# Patient Record
Sex: Male | Born: 1950 | Race: White | Hispanic: No | Marital: Married | Smoking: Former smoker
Health system: Southern US, Community
[De-identification: ages and names within clinical notes are randomized; demographics above are authoritative.]

## PROBLEM LIST (undated history)

## (undated) DIAGNOSIS — K56609 Unspecified intestinal obstruction, unspecified as to partial versus complete obstruction: Secondary | ICD-10-CM

## (undated) DIAGNOSIS — I10 Essential (primary) hypertension: Secondary | ICD-10-CM

## (undated) DIAGNOSIS — J189 Pneumonia, unspecified organism: Secondary | ICD-10-CM

## (undated) HISTORY — DX: Essential (primary) hypertension: I10

## (undated) HISTORY — DX: Unspecified intestinal obstruction, unspecified as to partial versus complete obstruction: K56.609

## (undated) HISTORY — DX: Porphyria cutanea tarda: E80.1

## (undated) HISTORY — DX: Pneumonia, unspecified organism: J18.9

---

## 1999-11-15 ENCOUNTER — Other Ambulatory Visit: Admission: RE | Admit: 1999-11-15 | Discharge: 1999-11-15 | Payer: Self-pay | Admitting: Gastroenterology

## 2014-05-08 LAB — HM COLONOSCOPY

## 2014-08-11 HISTORY — PX: HEMORROIDECTOMY: SUR656

## 2015-04-19 DIAGNOSIS — Z0000 Encounter for general adult medical examination without abnormal findings: Secondary | ICD-10-CM | POA: Diagnosis not present

## 2015-04-21 DIAGNOSIS — Z1159 Encounter for screening for other viral diseases: Secondary | ICD-10-CM | POA: Diagnosis not present

## 2015-04-21 DIAGNOSIS — Z6829 Body mass index (BMI) 29.0-29.9, adult: Secondary | ICD-10-CM | POA: Diagnosis not present

## 2015-04-21 DIAGNOSIS — Z0000 Encounter for general adult medical examination without abnormal findings: Secondary | ICD-10-CM | POA: Diagnosis not present

## 2015-06-14 DIAGNOSIS — Z4682 Encounter for fitting and adjustment of non-vascular catheter: Secondary | ICD-10-CM | POA: Diagnosis not present

## 2015-06-14 DIAGNOSIS — R109 Unspecified abdominal pain: Secondary | ICD-10-CM | POA: Diagnosis not present

## 2015-06-14 DIAGNOSIS — K5660 Unspecified intestinal obstruction: Secondary | ICD-10-CM | POA: Diagnosis not present

## 2015-06-14 DIAGNOSIS — Z87891 Personal history of nicotine dependence: Secondary | ICD-10-CM | POA: Diagnosis not present

## 2015-06-14 DIAGNOSIS — K6389 Other specified diseases of intestine: Secondary | ICD-10-CM | POA: Diagnosis not present

## 2015-06-14 DIAGNOSIS — R030 Elevated blood-pressure reading, without diagnosis of hypertension: Secondary | ICD-10-CM | POA: Diagnosis present

## 2015-06-14 DIAGNOSIS — K5669 Other intestinal obstruction: Secondary | ICD-10-CM | POA: Diagnosis not present

## 2015-08-08 DIAGNOSIS — H903 Sensorineural hearing loss, bilateral: Secondary | ICD-10-CM | POA: Diagnosis not present

## 2016-03-25 DIAGNOSIS — H1033 Unspecified acute conjunctivitis, bilateral: Secondary | ICD-10-CM | POA: Diagnosis not present

## 2016-03-25 DIAGNOSIS — Z6830 Body mass index (BMI) 30.0-30.9, adult: Secondary | ICD-10-CM | POA: Diagnosis not present

## 2016-04-15 DIAGNOSIS — H1032 Unspecified acute conjunctivitis, left eye: Secondary | ICD-10-CM | POA: Diagnosis not present

## 2016-05-29 DIAGNOSIS — Z6830 Body mass index (BMI) 30.0-30.9, adult: Secondary | ICD-10-CM | POA: Diagnosis not present

## 2016-05-29 DIAGNOSIS — Z0000 Encounter for general adult medical examination without abnormal findings: Secondary | ICD-10-CM | POA: Diagnosis not present

## 2016-05-29 DIAGNOSIS — Z136 Encounter for screening for cardiovascular disorders: Secondary | ICD-10-CM | POA: Diagnosis not present

## 2016-05-29 DIAGNOSIS — Z13220 Encounter for screening for lipoid disorders: Secondary | ICD-10-CM | POA: Diagnosis not present

## 2016-05-29 DIAGNOSIS — Z130 Encounter for screening for diseases of the blood and blood-forming organs and certain disorders involving the immune mechanism: Secondary | ICD-10-CM | POA: Diagnosis not present

## 2016-05-29 DIAGNOSIS — Z125 Encounter for screening for malignant neoplasm of prostate: Secondary | ICD-10-CM | POA: Diagnosis not present

## 2016-05-29 DIAGNOSIS — Z23 Encounter for immunization: Secondary | ICD-10-CM | POA: Diagnosis not present

## 2016-05-29 DIAGNOSIS — Z1159 Encounter for screening for other viral diseases: Secondary | ICD-10-CM | POA: Diagnosis not present

## 2016-05-29 DIAGNOSIS — Z131 Encounter for screening for diabetes mellitus: Secondary | ICD-10-CM | POA: Diagnosis not present

## 2016-06-07 DIAGNOSIS — Z130 Encounter for screening for diseases of the blood and blood-forming organs and certain disorders involving the immune mechanism: Secondary | ICD-10-CM | POA: Diagnosis not present

## 2016-06-07 DIAGNOSIS — Z13220 Encounter for screening for lipoid disorders: Secondary | ICD-10-CM | POA: Diagnosis not present

## 2016-06-07 DIAGNOSIS — Z1159 Encounter for screening for other viral diseases: Secondary | ICD-10-CM | POA: Diagnosis not present

## 2016-06-07 DIAGNOSIS — Z125 Encounter for screening for malignant neoplasm of prostate: Secondary | ICD-10-CM | POA: Diagnosis not present

## 2016-06-07 DIAGNOSIS — Z131 Encounter for screening for diabetes mellitus: Secondary | ICD-10-CM | POA: Diagnosis not present

## 2016-07-23 DIAGNOSIS — K56609 Unspecified intestinal obstruction, unspecified as to partial versus complete obstruction: Secondary | ICD-10-CM | POA: Diagnosis not present

## 2016-07-23 DIAGNOSIS — Z87891 Personal history of nicotine dependence: Secondary | ICD-10-CM | POA: Diagnosis not present

## 2016-07-23 DIAGNOSIS — K56699 Other intestinal obstruction unspecified as to partial versus complete obstruction: Secondary | ICD-10-CM | POA: Diagnosis not present

## 2016-07-23 DIAGNOSIS — I10 Essential (primary) hypertension: Secondary | ICD-10-CM | POA: Diagnosis present

## 2016-07-23 DIAGNOSIS — J029 Acute pharyngitis, unspecified: Secondary | ICD-10-CM | POA: Diagnosis not present

## 2016-07-23 DIAGNOSIS — R51 Headache: Secondary | ICD-10-CM | POA: Diagnosis present

## 2016-11-22 DIAGNOSIS — Z87891 Personal history of nicotine dependence: Secondary | ICD-10-CM | POA: Diagnosis not present

## 2016-11-22 DIAGNOSIS — Z7189 Other specified counseling: Secondary | ICD-10-CM | POA: Diagnosis not present

## 2016-11-22 DIAGNOSIS — K56609 Unspecified intestinal obstruction, unspecified as to partial versus complete obstruction: Secondary | ICD-10-CM | POA: Diagnosis not present

## 2016-11-22 DIAGNOSIS — J181 Lobar pneumonia, unspecified organism: Secondary | ICD-10-CM | POA: Diagnosis present

## 2016-11-22 DIAGNOSIS — Z23 Encounter for immunization: Secondary | ICD-10-CM | POA: Diagnosis not present

## 2016-11-22 DIAGNOSIS — K56600 Partial intestinal obstruction, unspecified as to cause: Secondary | ICD-10-CM | POA: Diagnosis not present

## 2016-11-22 DIAGNOSIS — I10 Essential (primary) hypertension: Secondary | ICD-10-CM | POA: Diagnosis present

## 2016-12-04 DIAGNOSIS — J90 Pleural effusion, not elsewhere classified: Secondary | ICD-10-CM | POA: Diagnosis not present

## 2016-12-04 DIAGNOSIS — Z6828 Body mass index (BMI) 28.0-28.9, adult: Secondary | ICD-10-CM | POA: Diagnosis not present

## 2016-12-04 DIAGNOSIS — K56609 Unspecified intestinal obstruction, unspecified as to partial versus complete obstruction: Secondary | ICD-10-CM | POA: Diagnosis not present

## 2016-12-04 DIAGNOSIS — J181 Lobar pneumonia, unspecified organism: Secondary | ICD-10-CM | POA: Diagnosis not present

## 2017-04-08 DIAGNOSIS — M955 Acquired deformity of pelvis: Secondary | ICD-10-CM | POA: Diagnosis not present

## 2017-04-08 DIAGNOSIS — M9905 Segmental and somatic dysfunction of pelvic region: Secondary | ICD-10-CM | POA: Diagnosis not present

## 2017-04-08 DIAGNOSIS — M5416 Radiculopathy, lumbar region: Secondary | ICD-10-CM | POA: Diagnosis not present

## 2017-04-08 DIAGNOSIS — M9903 Segmental and somatic dysfunction of lumbar region: Secondary | ICD-10-CM | POA: Diagnosis not present

## 2017-04-09 DIAGNOSIS — M955 Acquired deformity of pelvis: Secondary | ICD-10-CM | POA: Diagnosis not present

## 2017-04-09 DIAGNOSIS — M9905 Segmental and somatic dysfunction of pelvic region: Secondary | ICD-10-CM | POA: Diagnosis not present

## 2017-04-09 DIAGNOSIS — M5416 Radiculopathy, lumbar region: Secondary | ICD-10-CM | POA: Diagnosis not present

## 2017-04-09 DIAGNOSIS — M9903 Segmental and somatic dysfunction of lumbar region: Secondary | ICD-10-CM | POA: Diagnosis not present

## 2017-04-11 DIAGNOSIS — M955 Acquired deformity of pelvis: Secondary | ICD-10-CM | POA: Diagnosis not present

## 2017-04-11 DIAGNOSIS — M9903 Segmental and somatic dysfunction of lumbar region: Secondary | ICD-10-CM | POA: Diagnosis not present

## 2017-04-11 DIAGNOSIS — M5416 Radiculopathy, lumbar region: Secondary | ICD-10-CM | POA: Diagnosis not present

## 2017-04-11 DIAGNOSIS — M9905 Segmental and somatic dysfunction of pelvic region: Secondary | ICD-10-CM | POA: Diagnosis not present

## 2017-04-14 DIAGNOSIS — M5416 Radiculopathy, lumbar region: Secondary | ICD-10-CM | POA: Diagnosis not present

## 2017-04-14 DIAGNOSIS — M955 Acquired deformity of pelvis: Secondary | ICD-10-CM | POA: Diagnosis not present

## 2017-04-14 DIAGNOSIS — M9903 Segmental and somatic dysfunction of lumbar region: Secondary | ICD-10-CM | POA: Diagnosis not present

## 2017-04-14 DIAGNOSIS — M9905 Segmental and somatic dysfunction of pelvic region: Secondary | ICD-10-CM | POA: Diagnosis not present

## 2017-04-16 DIAGNOSIS — M9905 Segmental and somatic dysfunction of pelvic region: Secondary | ICD-10-CM | POA: Diagnosis not present

## 2017-04-16 DIAGNOSIS — M5416 Radiculopathy, lumbar region: Secondary | ICD-10-CM | POA: Diagnosis not present

## 2017-04-16 DIAGNOSIS — M9903 Segmental and somatic dysfunction of lumbar region: Secondary | ICD-10-CM | POA: Diagnosis not present

## 2017-04-16 DIAGNOSIS — M955 Acquired deformity of pelvis: Secondary | ICD-10-CM | POA: Diagnosis not present

## 2017-04-17 DIAGNOSIS — M9903 Segmental and somatic dysfunction of lumbar region: Secondary | ICD-10-CM | POA: Diagnosis not present

## 2017-04-17 DIAGNOSIS — M5416 Radiculopathy, lumbar region: Secondary | ICD-10-CM | POA: Diagnosis not present

## 2017-04-17 DIAGNOSIS — M9905 Segmental and somatic dysfunction of pelvic region: Secondary | ICD-10-CM | POA: Diagnosis not present

## 2017-04-17 DIAGNOSIS — M955 Acquired deformity of pelvis: Secondary | ICD-10-CM | POA: Diagnosis not present

## 2017-04-21 DIAGNOSIS — M9905 Segmental and somatic dysfunction of pelvic region: Secondary | ICD-10-CM | POA: Diagnosis not present

## 2017-04-21 DIAGNOSIS — M5416 Radiculopathy, lumbar region: Secondary | ICD-10-CM | POA: Diagnosis not present

## 2017-04-21 DIAGNOSIS — M9903 Segmental and somatic dysfunction of lumbar region: Secondary | ICD-10-CM | POA: Diagnosis not present

## 2017-04-21 DIAGNOSIS — M955 Acquired deformity of pelvis: Secondary | ICD-10-CM | POA: Diagnosis not present

## 2017-04-23 DIAGNOSIS — M9903 Segmental and somatic dysfunction of lumbar region: Secondary | ICD-10-CM | POA: Diagnosis not present

## 2017-04-23 DIAGNOSIS — M9905 Segmental and somatic dysfunction of pelvic region: Secondary | ICD-10-CM | POA: Diagnosis not present

## 2017-04-23 DIAGNOSIS — M955 Acquired deformity of pelvis: Secondary | ICD-10-CM | POA: Diagnosis not present

## 2017-04-23 DIAGNOSIS — M5416 Radiculopathy, lumbar region: Secondary | ICD-10-CM | POA: Diagnosis not present

## 2017-04-24 DIAGNOSIS — M5416 Radiculopathy, lumbar region: Secondary | ICD-10-CM | POA: Diagnosis not present

## 2017-04-24 DIAGNOSIS — M9905 Segmental and somatic dysfunction of pelvic region: Secondary | ICD-10-CM | POA: Diagnosis not present

## 2017-04-24 DIAGNOSIS — M9903 Segmental and somatic dysfunction of lumbar region: Secondary | ICD-10-CM | POA: Diagnosis not present

## 2017-04-24 DIAGNOSIS — M955 Acquired deformity of pelvis: Secondary | ICD-10-CM | POA: Diagnosis not present

## 2017-04-29 DIAGNOSIS — M9905 Segmental and somatic dysfunction of pelvic region: Secondary | ICD-10-CM | POA: Diagnosis not present

## 2017-04-29 DIAGNOSIS — M955 Acquired deformity of pelvis: Secondary | ICD-10-CM | POA: Diagnosis not present

## 2017-04-29 DIAGNOSIS — M9903 Segmental and somatic dysfunction of lumbar region: Secondary | ICD-10-CM | POA: Diagnosis not present

## 2017-04-29 DIAGNOSIS — M5416 Radiculopathy, lumbar region: Secondary | ICD-10-CM | POA: Diagnosis not present

## 2017-05-01 DIAGNOSIS — M955 Acquired deformity of pelvis: Secondary | ICD-10-CM | POA: Diagnosis not present

## 2017-05-01 DIAGNOSIS — M5416 Radiculopathy, lumbar region: Secondary | ICD-10-CM | POA: Diagnosis not present

## 2017-05-01 DIAGNOSIS — M9903 Segmental and somatic dysfunction of lumbar region: Secondary | ICD-10-CM | POA: Diagnosis not present

## 2017-05-01 DIAGNOSIS — M9905 Segmental and somatic dysfunction of pelvic region: Secondary | ICD-10-CM | POA: Diagnosis not present

## 2017-05-06 DIAGNOSIS — M5416 Radiculopathy, lumbar region: Secondary | ICD-10-CM | POA: Diagnosis not present

## 2017-05-06 DIAGNOSIS — M9903 Segmental and somatic dysfunction of lumbar region: Secondary | ICD-10-CM | POA: Diagnosis not present

## 2017-05-06 DIAGNOSIS — M955 Acquired deformity of pelvis: Secondary | ICD-10-CM | POA: Diagnosis not present

## 2017-05-06 DIAGNOSIS — M9905 Segmental and somatic dysfunction of pelvic region: Secondary | ICD-10-CM | POA: Diagnosis not present

## 2017-05-08 DIAGNOSIS — M955 Acquired deformity of pelvis: Secondary | ICD-10-CM | POA: Diagnosis not present

## 2017-05-08 DIAGNOSIS — M9905 Segmental and somatic dysfunction of pelvic region: Secondary | ICD-10-CM | POA: Diagnosis not present

## 2017-05-08 DIAGNOSIS — M5416 Radiculopathy, lumbar region: Secondary | ICD-10-CM | POA: Diagnosis not present

## 2017-05-08 DIAGNOSIS — M9903 Segmental and somatic dysfunction of lumbar region: Secondary | ICD-10-CM | POA: Diagnosis not present

## 2017-05-13 DIAGNOSIS — M955 Acquired deformity of pelvis: Secondary | ICD-10-CM | POA: Diagnosis not present

## 2017-05-13 DIAGNOSIS — M5416 Radiculopathy, lumbar region: Secondary | ICD-10-CM | POA: Diagnosis not present

## 2017-05-13 DIAGNOSIS — M9905 Segmental and somatic dysfunction of pelvic region: Secondary | ICD-10-CM | POA: Diagnosis not present

## 2017-05-13 DIAGNOSIS — M9903 Segmental and somatic dysfunction of lumbar region: Secondary | ICD-10-CM | POA: Diagnosis not present

## 2017-05-15 DIAGNOSIS — M9905 Segmental and somatic dysfunction of pelvic region: Secondary | ICD-10-CM | POA: Diagnosis not present

## 2017-05-15 DIAGNOSIS — M955 Acquired deformity of pelvis: Secondary | ICD-10-CM | POA: Diagnosis not present

## 2017-05-15 DIAGNOSIS — M9903 Segmental and somatic dysfunction of lumbar region: Secondary | ICD-10-CM | POA: Diagnosis not present

## 2017-05-15 DIAGNOSIS — M5416 Radiculopathy, lumbar region: Secondary | ICD-10-CM | POA: Diagnosis not present

## 2017-05-20 DIAGNOSIS — H903 Sensorineural hearing loss, bilateral: Secondary | ICD-10-CM | POA: Diagnosis not present

## 2017-05-21 DIAGNOSIS — M9903 Segmental and somatic dysfunction of lumbar region: Secondary | ICD-10-CM | POA: Diagnosis not present

## 2017-05-21 DIAGNOSIS — M5416 Radiculopathy, lumbar region: Secondary | ICD-10-CM | POA: Diagnosis not present

## 2017-05-21 DIAGNOSIS — M955 Acquired deformity of pelvis: Secondary | ICD-10-CM | POA: Diagnosis not present

## 2017-05-21 DIAGNOSIS — M9905 Segmental and somatic dysfunction of pelvic region: Secondary | ICD-10-CM | POA: Diagnosis not present

## 2017-06-04 DIAGNOSIS — M5416 Radiculopathy, lumbar region: Secondary | ICD-10-CM | POA: Diagnosis not present

## 2017-06-04 DIAGNOSIS — M9903 Segmental and somatic dysfunction of lumbar region: Secondary | ICD-10-CM | POA: Diagnosis not present

## 2017-06-04 DIAGNOSIS — M955 Acquired deformity of pelvis: Secondary | ICD-10-CM | POA: Diagnosis not present

## 2017-06-04 DIAGNOSIS — M9905 Segmental and somatic dysfunction of pelvic region: Secondary | ICD-10-CM | POA: Diagnosis not present

## 2017-06-25 DIAGNOSIS — M5416 Radiculopathy, lumbar region: Secondary | ICD-10-CM | POA: Diagnosis not present

## 2017-06-25 DIAGNOSIS — M9903 Segmental and somatic dysfunction of lumbar region: Secondary | ICD-10-CM | POA: Diagnosis not present

## 2017-06-25 DIAGNOSIS — M955 Acquired deformity of pelvis: Secondary | ICD-10-CM | POA: Diagnosis not present

## 2017-06-25 DIAGNOSIS — M9905 Segmental and somatic dysfunction of pelvic region: Secondary | ICD-10-CM | POA: Diagnosis not present

## 2017-06-30 ENCOUNTER — Encounter: Payer: Self-pay | Admitting: Primary Care

## 2017-06-30 ENCOUNTER — Encounter (INDEPENDENT_AMBULATORY_CARE_PROVIDER_SITE_OTHER): Payer: Self-pay

## 2017-06-30 ENCOUNTER — Ambulatory Visit (INDEPENDENT_AMBULATORY_CARE_PROVIDER_SITE_OTHER): Payer: Medicare Other | Admitting: Primary Care

## 2017-06-30 ENCOUNTER — Ambulatory Visit (INDEPENDENT_AMBULATORY_CARE_PROVIDER_SITE_OTHER)
Admission: RE | Admit: 2017-06-30 | Discharge: 2017-06-30 | Disposition: A | Discharge status: Home / Self Care | Payer: Medicare Other | Source: Ambulatory Visit | Attending: Primary Care | Admitting: Primary Care

## 2017-06-30 ENCOUNTER — Other Ambulatory Visit: Payer: Self-pay | Admitting: Primary Care

## 2017-06-30 VITALS — BP 140/70 | HR 73 | Temp 98.1°F | Ht 73.0 in | Wt 215.2 lb

## 2017-06-30 DIAGNOSIS — R2242 Localized swelling, mass and lump, left lower limb: Secondary | ICD-10-CM

## 2017-06-30 DIAGNOSIS — R011 Cardiac murmur, unspecified: Secondary | ICD-10-CM

## 2017-06-30 DIAGNOSIS — N529 Male erectile dysfunction, unspecified: Secondary | ICD-10-CM

## 2017-06-30 DIAGNOSIS — M19072 Primary osteoarthritis, left ankle and foot: Secondary | ICD-10-CM | POA: Diagnosis not present

## 2017-06-30 NOTE — Assessment & Plan Note (Signed)
Represents ganglion cyst on exam. Xray today without involvement to the bone, but suggesting Tophus given degeneration. Will check uric acid levels to rule this out, likely benign cyst.

## 2017-06-30 NOTE — Assessment & Plan Note (Signed)
Chronic for years. Will check morning testosterone and estrogen levels. Consider Urology referral for further evaluation. He will return for labs during the hours of 8am-10 am.

## 2017-06-30 NOTE — Assessment & Plan Note (Signed)
Evidence on exam. Given this finding and family history of congenital valve disorder, will obtain echocardiogram. He is asymptomatic.

## 2017-06-30 NOTE — Patient Instructions (Signed)
Complete xray(s) prior to leaving today. I will notify you of your results once received.  You will be contacted regarding your echocardiogram.  Please let us know if you have not been contacted within one week.   Schedule a lab only appointment to return between 8 am and 10 am.  It was a pleasure to meet you today! Please don't hesitate to call or message me with any questions. Welcome to [facility]!

## 2017-06-30 NOTE — Progress Notes (Signed)
Subjective:    Patient ID: Larry Bentley, male    DOB: November 30, 1950, 67 y.o.   MRN: [id number]  HPI  [name] is a 67 year old male who presents today to establish care and discuss the problems mentioned below. Will obtain old records.  BP Readings from Last 3 Encounters:  06/30/17 140/70   1) Erectile Dysfunction: Symptoms of difficulty maintaining an erection about 90% of the time during intercourse. He did see a doctor several years ago who checked labs including estrogen and testosterone. His testosterone levels were low and estrogen levels were high. He did take an OTC supplement which helped initially but doesn't help much now. He's not undergone re-evaluation in several years.   2) Valve Disorder: Family history of congenital valve disorder to either bicuspid or aortic valve. It was recommended he undergo an echocardiogram for further evaluation. His father and nephew were diagnosed with this condition. His nephew experienced a heart attack during a soccer game because of this disorder. He denies chest pain, shortness of breath, fatigue, lower extremity edema.  3) Foot Mass: Noticed to left medial foot at MTP joint that has been present for the last 2 years. He denies pain, difficulty walking, redness, swelling. He's not bothered by this mass but was curious as to the etiology.   Review of Systems  Eyes: Negative for visual disturbance.  Respiratory: Negative for shortness of breath.   Cardiovascular: Negative for chest pain.  Genitourinary:       Erectile dysfunction  Skin:       Superficial skin mass  Neurological: Negative for dizziness and headaches.       Past Medical History:  Diagnosis Date  . Hypertension   . Pneumonia   . Porphyria cutanea tarda (HCC)   . SBO (small bowel obstruction) (HCC)      Social History   Socioeconomic History  . Marital status: Married    Spouse name: Not on file  . Number of children: Not on file  . Years of education: Not on file    . Highest education level: Not on file  Occupational History  . Not on file  Social Needs  . Financial resource strain: Not on file  . Food insecurity:    Worry: Not on file    Inability: Not on file  . Transportation needs:    Medical: Not on file    Non-medical: Not on file  Tobacco Use  . Smoking status: Former Games developermoker  . Smokeless tobacco: Never Used  Substance and Sexual Activity  . Alcohol use: Yes  . Drug use: Not on file  . Sexual activity: Not on file  Lifestyle  . Physical activity:    Days per week: Not on file    Minutes per session: Not on file  . Stress: Not on file  Relationships  . Social connections:    Talks on phone: Not on file    Gets together: Not on file    Attends religious service: Not on file    Active member of club or organization: Not on file    Attends meetings of clubs or organizations: Not on file    Relationship status: Not on file  . Intimate partner violence:    Fear of current or ex partner: Not on file    Emotionally abused: Not on file    Physically abused: Not on file    Forced sexual activity: Not on file  Other Topics Concern  . Not on  file  Social History Narrative   Married.   1 biologic child. 4 grandchildren.    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   Enjoys projects around the home, computers.     Past Surgical History:  Procedure Laterality Date  . HEMORROIDECTOMY  08/11/2014    Family History  Problem Relation Age of Onset  . Arthritis Mother   . Heart disease Mother   . Heart disease Father   . Hyperlipidemia Father   . Hypertension Father   . Cancer Maternal Grandfather        brain  . Stroke Paternal Grandmother   . Cancer Paternal Grandfather        lung    No Known Allergies  Current Outpatient Medications on File Prior to Visit  Medication Sig Dispense Refill  . L-ARGININE-500 PO Take by mouth.    Marland Kitchen OVER THE COUNTER MEDICATION Metagenics Men's vitality vitamin  Taking 1 packet by mouth once  daily    . OVER THE COUNTER MEDICATION Met[employer]ics Testralin  Taking 1 tablet by mouth once daily    . OVER THE COUNTER MEDICATION Metagenics Methyl Care  Taking 1 capsule by mouth two times a day    . OVER THE COUNTER MEDICATION Metagenics Sinuplex  Taking 1 tablet by mouth once daily    . OVER THE COUNTER MEDICATION New Chapter Zyflamend  Taking 1 capsule by mouth two times a day    . Polyethylene Glycol 3350 (MIRALAX PO) Take by mouth.     No current facility-administered medications on file prior to visit.     BP 140/70   Pulse 73   Temp 98.1 F (36.7 C) (Oral)   Ht 6\' 1"  (1.854 m)   Wt 215 lb 4 oz (97.6 kg)   SpO2 99%   BMI 28.40 kg/m    Objective:   Physical Exam  Constitutional: He appears well-nourished.  Neck: Neck supple.  Cardiovascular: Normal rate and regular rhythm.  Murmur heard. Mild-moderately loud murmur over aortic region. No radiation to carotid arteries.   Respiratory: Effort normal and breath sounds normal.  Skin: Skin is warm and dry.  Superficial, soft, immobile, non tender, flesh colored mass measuring 2 cm in circumference to left MTP joint.   Psychiatric: He has a normal mood and affect.           Assessment & Plan:

## 2017-07-01 ENCOUNTER — Other Ambulatory Visit (INDEPENDENT_AMBULATORY_CARE_PROVIDER_SITE_OTHER): Payer: Medicare Other

## 2017-07-01 DIAGNOSIS — N529 Male erectile dysfunction, unspecified: Secondary | ICD-10-CM

## 2017-07-01 DIAGNOSIS — R2242 Localized swelling, mass and lump, left lower limb: Secondary | ICD-10-CM

## 2017-07-01 LAB — URIC ACID: Uric Acid, Serum: 6.1 mg/dL (ref 4.0–7.8)

## 2017-07-02 ENCOUNTER — Ambulatory Visit (INDEPENDENT_AMBULATORY_CARE_PROVIDER_SITE_OTHER): Payer: Medicare Other

## 2017-07-02 ENCOUNTER — Other Ambulatory Visit: Payer: Self-pay

## 2017-07-02 DIAGNOSIS — R011 Cardiac murmur, unspecified: Secondary | ICD-10-CM

## 2017-07-05 LAB — TESTOS,TOTAL,FREE AND SHBG (FEMALE)
Free Testosterone: 53 pg/mL (ref 35.0–155.0)
Sex Hormone Binding: 43 nmol/L (ref 22–77)
Testosterone, Total, LC-MS-MS: 530 ng/dL (ref 250–1100)

## 2017-07-05 LAB — ESTROGENS, TOTAL: ESTROGEN: 140.2 pg/mL (ref 60–190)

## 2017-07-17 ENCOUNTER — Ambulatory Visit (INDEPENDENT_AMBULATORY_CARE_PROVIDER_SITE_OTHER): Payer: Medicare Other

## 2017-07-17 ENCOUNTER — Encounter: Payer: Self-pay | Admitting: Primary Care

## 2017-07-17 ENCOUNTER — Ambulatory Visit (INDEPENDENT_AMBULATORY_CARE_PROVIDER_SITE_OTHER): Payer: Medicare Other | Admitting: Primary Care

## 2017-07-17 VITALS — BP 140/70 | HR 60 | Temp 97.5°F | Ht 73.5 in | Wt 215.0 lb

## 2017-07-17 DIAGNOSIS — Z0000 Encounter for general adult medical examination without abnormal findings: Secondary | ICD-10-CM

## 2017-07-17 DIAGNOSIS — R2242 Localized swelling, mass and lump, left lower limb: Secondary | ICD-10-CM

## 2017-07-17 DIAGNOSIS — Z1159 Encounter for screening for other viral diseases: Secondary | ICD-10-CM | POA: Diagnosis not present

## 2017-07-17 DIAGNOSIS — E663 Overweight: Secondary | ICD-10-CM | POA: Insufficient documentation

## 2017-07-17 DIAGNOSIS — Z125 Encounter for screening for malignant neoplasm of prostate: Secondary | ICD-10-CM

## 2017-07-17 DIAGNOSIS — Z23 Encounter for immunization: Secondary | ICD-10-CM

## 2017-07-17 DIAGNOSIS — R011 Cardiac murmur, unspecified: Secondary | ICD-10-CM | POA: Diagnosis not present

## 2017-07-17 MED ORDER — ZOSTER VAC RECOMB ADJUVANTED 50 MCG/0.5ML IM SUSR: 0.5000 mL | Freq: Once | INTRAMUSCULAR | 1 refills | Status: AC

## 2017-07-17 NOTE — Patient Instructions (Addendum)
[name] , Thank you for taking time to come for your Medicare Wellness Visit. I appreciate your ongoing commitment to your health goals. Please review the following plan we discussed and let me know if I can assist you in the future.   These are the goals we discussed: Goals    . DIET - INCREASE WATER INTAKE     Starting 07/17/2017, I will attempt to drink at least 6-8 glasses of water daily.        This is a list of the screening recommended for you and due dates:  Health Maintenance  Topic Date Due  . Tetanus Vaccine  07/18/2018*  .  Hepatitis C: One time screening is recommended by Center for Disease Control  (CDC) for  adults born from [birth year] through 1965.   01/07/2019*  . Pneumonia vaccines (1 of 2 - PCV13) 01/07/2019*  . Flu Shot  08/07/2017  . Colon Cancer Screening  05/07/2024  *Topic was postponed. The date shown is not the original due date.   Preventive Care for Adults  A healthy lifestyle and preventive care can promote health and wellness. Preventive health guidelines for adults include the following key practices.  . A routine yearly physical is a good way to check with your health care provider about your health and preventive screening. It is a chance to share any concerns and updates on your health and to receive a thorough exam.  . Visit your dentist for a routine exam and preventive care every 6 months. Brush your teeth twice a day and floss once a day. Good oral hygiene prevents tooth decay and gum disease.  . The frequency of eye exams is based on your age, health, family medical history, use  of contact lenses, and other factors. Follow your health care provider's recommendations for frequency of eye exams.  . Eat a healthy diet. Foods like vegetables, fruits, whole grains, low-fat dairy products, and lean protein foods contain the nutrients you need without too many calories. Decrease your intake of foods high in solid fats, added sugars, and salt. Eat the right  amount of calories for you. Get information about a proper diet from your health care provider, if necessary.  . Regular physical exercise is one of the most important things you can do for your health. Most adults should get at least 150 minutes of moderate-intensity exercise (any activity that increases your heart rate and causes you to sweat) each week. In addition, most adults need muscle-strengthening exercises on 2 or more days a week.  Silver Sneakers may be a benefit available to you. To determine eligibility, you may visit the website: www.silversneakers.com or contact program at [phone] Mon-Fri between 8AM-8PM.   . Maintain a healthy weight. The body mass index (BMI) is a screening tool to identify possible weight problems. It provides an estimate of body fat based on height and weight. Your health care provider can find your BMI and can help you achieve or maintain a healthy weight.   For adults 20 years and older: ? A BMI below 18.5 is considered underweight. ? A BMI of 18.5 to 24.9 is normal. ? A BMI of 25 to 29.9 is considered overweight. ? A BMI of 30 and above is considered obese.   . Maintain normal blood lipids and cholesterol levels by exercising and minimizing your intake of saturated fat. Eat a balanced diet with plenty of fruit and vegetables. Blood tests for lipids and cholesterol should begin at age 68 and  be repeated every 5 years. If your lipid or cholesterol levels are high, you are over 50, or you are at high risk for heart disease, you may need your cholesterol levels checked more frequently. Ongoing high lipid and cholesterol levels should be treated with medicines if diet and exercise are not working.  . If you smoke, find out from your health care provider how to quit. If you do not use tobacco, please do not start.  . If you choose to drink alcohol, please do not consume more than 2 drinks per day. One drink is considered to be 12 ounces (355 mL) of beer, 5  ounces (148 mL) of wine, or 1.5 ounces (44 mL) of liquor.  . If you are 39-53 years old, ask your health care provider if you should take aspirin to prevent strokes.  . Use sunscreen. Apply sunscreen liberally and repeatedly throughout the day. You should seek shade when your shadow is shorter than you. Protect yourself by wearing long sleeves, pants, a wide-brimmed hat, and sunglasses year round, whenever you are outdoors.  . Once a month, do a whole body skin exam, using a mirror to look at the skin on your back. Tell your health care provider of new moles, moles that have irregular borders, moles that are larger than a pencil eraser, or moles that have changed in shape or color.

## 2017-07-17 NOTE — Progress Notes (Signed)
I reviewed health advisor's note, was available for consultation, and agree with documentation and plan.  

## 2017-07-17 NOTE — Patient Instructions (Addendum)
You will be contacted regarding your referral to podiatry.  Please let us know if you have not been contacted within one week.   You were provided with a pneumonia vaccination today.   Take the shingles vaccination to your pharmacy in one month or later for administration. Check with pricing first.   You should be getting 150 minutes of exercise weekly.  Make sure to eat plenty of vegetables, fruit, whole grains, lean protein.  Schedule a lab only appointment to return fasting for at least 4 hours. You may have water and black coffee.  Follow up in 1 year for your annual exam or sooner if needed.  It was a pleasure to see you today!

## 2017-07-17 NOTE — Progress Notes (Signed)
PCP notes:   Health maintenance:  Hep C screening - will complete with future labs Tetanus vaccine - postponed/insurance  Abnormal screenings:   None  Patient concerns:   None  Nurse concerns:  None  Next PCP appt:   07/17/2017 @ 1200

## 2017-07-17 NOTE — Progress Notes (Signed)
Subjective:   Larry Bentley is a 67 y.o. male who presents for Medicare Annual/Subsequent preventive examination.  Review of Systems:  N/A Cardiac Risk Factors include: advanced age (>47men, >39 women)     Objective:    Vitals: BP 140/70 (BP Location: Right Arm, Patient Position: Sitting, Cuff Size: Normal)   Pulse 60   Temp (!) 97.5 F (36.4 C) (Oral)   Ht 6' 1.5" (1.867 m) Comment: shoes  Wt 215 lb (97.5 kg)   SpO2 100%   BMI 27.98 kg/m   Body mass index is 27.98 kg/m.  Advanced Directives 07/17/2017  Does Patient Have a Medical Advance Directive? No  Would patient like information on creating a medical advance directive? No - Patient declined    Tobacco Social History   Tobacco Use  Smoking Status Former Smoker  . Last attempt to quit: 01/08/1987  . Years since quitting: 30.5  Smokeless Tobacco Never Used     Counseling given: No   Clinical Intake:  Pre-visit preparation completed: Yes  Pain : No/denies pain Pain Score: 0-No pain     Nutritional Status: BMI 25 -29 Overweight Nutritional Risks: None Diabetes: No  How often do you need to have someone help you when you read instructions, pamphlets, or other written materials from your doctor or pharmacy?: 1 - Never What is the last grade level you completed in school?: Bachelors degree  Interpreter Needed?: No  Comments: pt lives with spouse Information entered by :: LPinson, LPN  Past Medical History:  Diagnosis Date  . Hypertension   . Pneumonia   . Porphyria cutanea tarda (HCC)   . SBO (small bowel obstruction) (HCC)    Past Surgical History:  Procedure Laterality Date  . HEMORROIDECTOMY  08/11/2014   Family History  Problem Relation Age of Onset  . Arthritis Mother   . Heart disease Mother   . Heart disease Father   . Hyperlipidemia Father   . Hypertension Father   . Cancer Maternal Grandfather        brain  . Stroke Paternal Grandmother   . Cancer Paternal Grandfather        lung    Social History   Socioeconomic History  . Marital status: Married    Spouse name: Not on file  . Number of children: Not on file  . Years of education: Not on file  . Highest education level: Not on file  Occupational History  . Not on file  Social Needs  . Financial resource strain: Not on file  . Food insecurity:    Worry: Not on file    Inability: Not on file  . Transportation needs:    Medical: Not on file    Non-medical: Not on file  Tobacco Use  . Smoking status: Former Smoker    Last attempt to quit: 01/08/1987    Years since quitting: 30.5  . Smokeless tobacco: Never Used  Substance and Sexual Activity  . Alcohol use: Yes    Comment: occasionally  . Drug use: Not Currently  . Sexual activity: Not on file  Lifestyle  . Physical activity:    Days per week: Not on file    Minutes per session: Not on file  . Stress: Not on file  Relationships  . Social connections:    Talks on phone: Not on file    Gets together: Not on file    Attends religious service: Not on file    Active member of club or organization:  Not on file    Attends meetings of clubs or organizations: Not on file    Relationship status: Not on file  Other Topics Concern  . Not on file  Social History Narrative   Married.   1 biologic child. 4 grandchildren.    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   Enjoys projects around the home, computers.     Outpatient Encounter Medications as of 07/17/2017  Medication Sig  . L-ARGININE-500 PO Take by mouth.  Marland Kitchen. OVER THE COUNTER MEDICATION Metagenics Men's vitality vitamin  Taking 1 packet by mouth once daily  . OVER THE COUNTER MEDICATION [employer]ics Testralin  Taking 1 tablet by mouth once daily  . OVER THE COUNTER MEDICATION Met[employer]ics Methyl Care  Taking 1 capsule by mouth two times a day  . OVER THE COUNTER MEDICATION Met[employer]ics Sinuplex  Taking 1 tablet by mouth once daily  . OVER THE COUNTER MEDICATION [employer] Chapter Zyflamend  Taking 1  capsule by mouth two times a day  . Polyethylene Glycol 3350 (MIRALAX PO) Take by mouth.   No facility-administered encounter medications on file as of 07/17/2017.     Activities of Daily Living In your present state of health, do you have any difficulty performing the following activities: 07/17/2017  Hearing? Y  Vision? N  Difficulty concentrating or making decisions? N  Walking or climbing stairs? N  Dressing or bathing? N  Doing errands, shopping? N  Preparing Food and eating ? N  Using the Toilet? N  In the past six months, have you accidently leaked urine? N  Do you have problems with loss of bowel control? N  Managing your Medications? N  Managing your Finances? N  Housekeeping or managing your Housekeeping? N  Some recent data might be hidden    Patient Care Team: Doreene Nestlark, Katherine K, NP as PCP - General (Internal Medicine) Blair PromiseBulakowski, Neill, OD as Referring Physician (Optometry)   Assessment:   This is a routine wellness examination for Larry Bentley.  Exercise Activities and Dietary recommendations Current Exercise Habits: The patient does not participate in regular exercise at present, Exercise limited by: None identified  Goals    . DIET - INCREASE WATER INTAKE     Starting 07/17/2017, I will attempt to drink at least 6-8 glasses of water daily.        Fall Risk Fall Risk  07/17/2017  Falls in the past year? No   Depression Screen PHQ 2/9 Scores 07/17/2017  PHQ - 2 Score 0  PHQ- 9 Score 0    Cognitive Function MMSE - Mini Mental State Exam 07/17/2017  Orientation to time 5  Orientation to Place 5  Registration 3  Attention/ Calculation 0  Recall 3  Language- name 2 objects 0  Language- repeat 1  Language- follow 3 step command 3  Language- read & follow direction 0  Write a sentence 0  Copy design 0  Total score 20     PLEASE NOTE: A Mini-Cog screen was completed. Maximum score is 20. A value of 0 denotes this part of Folstein MMSE was not completed or  the patient failed this part of the Mini-Cog screening.   Mini-Cog Screening Orientation to Time - Max 5 pts Orientation to Place - Max 5 pts Registration - Max 3 pts Recall - Max 3 pts Language Repeat - Max 1 pts Language Follow 3 Step Command - Max 3 pts     Immunization History  Administered Date(s) Administered  . Pneumococcal Conjugate-13 07/17/2017  Screening Tests Health Maintenance  Topic Date Due  . TETANUS/TDAP  07/18/2018 (Originally 08[birth year]/1968)  . Hepatitis C Screening  01/07/2019 (Originally 08/26/1950)  . INFLUENZA VACCINE  08/07/2017  . PNA vac Low Risk Adult (2 of 2 - PPSV23) 07/18/2018  . COLONOSCOPY  05/07/2024     Plan:     I have personally reviewed, addressed, and noted the following in the patient's chart:  A. Medical and social history B. Use of alcohol, tobacco or illicit drugs  C. Current medications and supplements D. Functional ability and status E.  Nutritional status F.  Physical activity G. Advance directives H. List of other physicians I.  Hospitalizations, surgeries, and ER visits in previous 12 months J.  Vitals K. Screenings to include hearing, vision, cognitive, depression L. Referrals and appointments - none  In addition, I have reviewed and discussed with patient certain preventive protocols, quality metrics, and best practice recommendations. A written personalized care plan for preventive services as well as general preventive health recommendations were provided to patient.  See attached scanned questionnaire for additional information.   Signed,   Randa Evens, MHA, BS, LPN Health Coach

## 2017-07-17 NOTE — Assessment & Plan Note (Signed)
Discussed the importance of a healthy diet and regular exercise in order for weight loss, and to reduce the risk of any potential medical problems.  Check lipids and CMP.

## 2017-07-17 NOTE — Addendum Note (Signed)
Addended by: Tawnya CrookSAMBATH, Zyere Jiminez on: 07/17/2017 12:59 PM   Modules accepted: Orders

## 2017-07-17 NOTE — Progress Notes (Signed)
Subjective:    Patient ID: Larry Bentley, male    DOB: [birth year], [age] y.o.   MRN: [id number]  HPI  [name] is a 67 year old male who presents today for MWV Part 2. He saw our health advisor this morning.   Immunizations: -Tetanus: over 10 years. He will check with insurance regarding cost. -Influenza: Due this season -Pneumonia: Never completed, due.  -Shingles: Never completed, due.    Eye exam: Completed in June 2019 Dental exam: Completed in 2019 Colonoscopy: Completed in 2016, due in 2026 PSA: Due Hep C Screen: Due   1) Murmur: Endorses family history of congenital valve disorder in his father and nephew. He underwent echocardiogram which showed mild diastolic dysfunction, mild enlargement of the left ventricle, mild mitral valve regurgitation and mild-moderate tricuspid valve regurgitation.   He denies chest pain, shortness of breath, dizziness, cough, lower extremity edema.  Review of Systems  Constitutional: Negative for fatigue.  Respiratory: Negative for shortness of breath.   Cardiovascular: Negative for chest pain and leg swelling.  Genitourinary:       Erectile dysfunction  Musculoskeletal:       Left foot mass  Neurological: Negative for dizziness and light-headedness.       Past Medical History:  Diagnosis Date  . Hypertension   . Pneumonia   . Porphyria cutanea tarda (HCC)   . SBO (small bowel obstruction) (HCC)      Social History   Socioeconomic History  . Marital status: Married    Spouse name: Not on file  . Number of children: Not on file  . Years of education: Not on file  . Highest education level: Not on file  Occupational History  . Not on file  Social Needs  . Financial resource strain: Not on file  . Food insecurity:    Worry: Not on file    Inability: Not on file  . Transportation needs:    Medical: Not on file    Non-medical: Not on file  Tobacco Use  . Smoking status: Former Smoker    Last attempt to quit: 01/08/1987    Years since quitting: 30.5  . Smokeless tobacco: Never Used  Substance and Sexual Activity  . Alcohol use: Yes    Comment: occasionally  . Drug use: Not Currently  . Sexual activity: Not on file  Lifestyle  . Physical activity:    Days per week: Not on file    Minutes per session: Not on file  . Stress: Not on file  Relationships  . Social connections:    Talks on phone: Not on file    Gets together: Not on file    Attends religious service: Not on file    Active member of club or organization: Not on file    Attends meetings of clubs or organizations: Not on file    Relationship status: Not on file  . Intimate partner violence:    Fear of current or ex partner: Not on file    Emotionally abused: Not on file    Physically abused: Not on file    Forced sexual activity: Not on file  Other Topics Concern  . Not on file  Social History Narrative   Married.   1 biologic child. 4 grandchildren.    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   Enjoys projects around the home, computers.     Past Surgical History:  Procedure Laterality Date  . HEMORROIDECTOMY  08/11/2014    Family History  Problem Relation Age of Onset  . Arthritis Mother   . Heart disease Mother   . Heart disease Father   . Hyperlipidemia Father   . Hypertension Father   . Cancer Maternal Grandfather        brain  . Stroke Paternal Grandmother   . Cancer Paternal Grandfather        lung    No Known Allergies  Current Outpatient Medications on File Prior to Visit  Medication Sig Dispense Refill  . L-ARGININE-500 PO Take by mouth.    Marland Kitchen OVER THE COUNTER MEDICATION Metagenics Men's vitality vitamin  Taking 1 packet by mouth once daily    . OVER THE COUNTER MEDICATION [employer] Testralin  Taking 1 tablet by mouth once daily    . OVER THE COUNTER MEDICATION [employer] Methyl Care  Taking 1 capsule by mouth two times a day    . OVER THE COUNTER MEDICATION [employer] Sinuplex  Taking 1 tablet by mouth  once daily    . OVER THE COUNTER MEDICATION [place] Zyflamend  Taking 1 capsule by mouth two times a day    . Polyethylene Glycol 3350 (MIRALAX PO) Take by mouth.     No current facility-administered medications on file prior to visit.     BP 140/70 (BP Location: Right Arm, Patient Position: Sitting, Cuff Size: Normal)   Pulse 60   Temp (!) 97.5 F (36.4 C) (Oral)   Ht 6' 1.5" (1.867 m) Comment: shoes  Wt 215 lb (97.5 kg)   SpO2 100%   BMI 27.98 kg/m    Objective:   Physical Exam  Constitutional: He is oriented to person, place, and time. He appears well-nourished.  HENT:  Mouth/Throat: No oropharyngeal exudate.  Eyes: Pupils are equal, round, and reactive to light. EOM are normal.  Neck: Neck supple. No thyromegaly present.  Cardiovascular: Normal rate and regular rhythm.  Respiratory: Effort normal and breath sounds normal.  GI: Soft. Bowel sounds are normal. There is no tenderness.  Musculoskeletal: Normal range of motion.       Feet:  Raised, soft, immobile, non tender, flesh colored mass to left MCP joint of first great toe.   Neurological: He is alert and oriented to person, place, and time.  Skin: Skin is warm and dry.  Psychiatric: He has a normal mood and affect.           Assessment & Plan:

## 2017-07-17 NOTE — Assessment & Plan Note (Signed)
Recent echocardiogram with mild diastolic dysfunction, mild enlargement of the left ventricle, mild mitral valve regurgitation and mild-moderate tricuspid valve regurgitation.   Given that he's asymptomatic we will continue to monitor the murmur and repeat echocardiogram in 1-2 years.

## 2017-07-17 NOTE — Assessment & Plan Note (Signed)
Xray without osseous involvement. After further look this could be cyst, but also bunion flare. Will send to podiatry for further evaluation and potential removal.

## 2017-07-18 ENCOUNTER — Other Ambulatory Visit (INDEPENDENT_AMBULATORY_CARE_PROVIDER_SITE_OTHER): Payer: Medicare Other

## 2017-07-18 DIAGNOSIS — Z1159 Encounter for screening for other viral diseases: Secondary | ICD-10-CM

## 2017-07-18 DIAGNOSIS — Z125 Encounter for screening for malignant neoplasm of prostate: Secondary | ICD-10-CM | POA: Diagnosis not present

## 2017-07-18 DIAGNOSIS — E663 Overweight: Secondary | ICD-10-CM

## 2017-07-18 LAB — COMPREHENSIVE METABOLIC PANEL
ALT: 14 U/L (ref 0–53)
AST: 17 U/L (ref 0–37)
Albumin: 4.5 g/dL (ref 3.5–5.2)
Alkaline Phosphatase: 42 U/L (ref 39–117)
BILIRUBIN TOTAL: 0.7 mg/dL (ref 0.2–1.2)
BUN: 22 mg/dL (ref 6–23)
CO2: 30 meq/L (ref 19–32)
Calcium: 9.2 mg/dL (ref 8.4–10.5)
Chloride: 104 mEq/L (ref 96–112)
Creatinine, Ser: 0.95 mg/dL (ref 0.40–1.50)
GFR: 83.81 mL/min (ref >60.00)
GLUCOSE: 86 mg/dL (ref 70–99)
Potassium: 4.1 mEq/L (ref 3.5–5.1)
Sodium: 141 mEq/L (ref 135–145)
TOTAL PROTEIN: 6.8 g/dL (ref 6.0–8.3)

## 2017-07-18 LAB — LIPID PANEL
CHOL/HDL RATIO: 4
Cholesterol: 178 mg/dL (ref 0–200)
HDL: 46.3 mg/dL (ref >39.00)
LDL Cholesterol: 114 mg/dL — ABNORMAL HIGH (ref 0–99)
NONHDL: 131.76
TRIGLYCERIDES: 90 mg/dL (ref 0.0–149.0)
VLDL: 18 mg/dL (ref 0.0–40.0)

## 2017-07-18 LAB — PSA, MEDICARE: PSA: 0.39 ng/ml (ref 0.10–4.00)

## 2017-07-19 LAB — HEPATITIS C ANTIBODY
Hepatitis C Ab: NONREACTIVE
SIGNAL TO CUT-OFF: 0.01 (ref <1.00)

## 2017-07-23 DIAGNOSIS — M955 Acquired deformity of pelvis: Secondary | ICD-10-CM | POA: Diagnosis not present

## 2017-07-23 DIAGNOSIS — M5416 Radiculopathy, lumbar region: Secondary | ICD-10-CM | POA: Diagnosis not present

## 2017-07-23 DIAGNOSIS — M9905 Segmental and somatic dysfunction of pelvic region: Secondary | ICD-10-CM | POA: Diagnosis not present

## 2017-07-23 DIAGNOSIS — M9903 Segmental and somatic dysfunction of lumbar region: Secondary | ICD-10-CM | POA: Diagnosis not present

## 2017-08-04 ENCOUNTER — Encounter: Payer: Self-pay | Admitting: Podiatry

## 2017-08-04 ENCOUNTER — Ambulatory Visit (INDEPENDENT_AMBULATORY_CARE_PROVIDER_SITE_OTHER): Payer: Medicare Other | Admitting: Podiatry

## 2017-08-04 VITALS — BP 145/82 | HR 62

## 2017-08-04 DIAGNOSIS — M2012 Hallux valgus (acquired), left foot: Secondary | ICD-10-CM

## 2017-08-04 DIAGNOSIS — M205X1 Other deformities of toe(s) (acquired), right foot: Secondary | ICD-10-CM

## 2017-08-04 DIAGNOSIS — M6740 Ganglion, unspecified site: Secondary | ICD-10-CM

## 2017-08-04 NOTE — Progress Notes (Signed)
His patient presents to the office with chief complaint of an unknown mass at the site of his bunion, left foot.  He says that he is not having any pain or discomfort.  He says he wants to get rid of it.  He says he was evaluated and treated by his PCP and x-rays were taken.  He was then referred to this office for an evaluation and treatment.  General Appearance  Alert, conversant and in no acute stress.  Vascular  Dorsalis pedis and posterior tibial  pulses are palpable  bilaterally.  Capillary return is within normal limits  bilaterally. Temperature is within normal limits  bilaterally.  Neurologic  Senn-Weinstein monofilament wire test within normal limits  bilaterally. Muscle power within normal limits bilaterally.  Nails Normal  nails with no evidence of any bacterial or fungal infection  Orthopedic  HAV  B/L.  Ganglion medial aspect left 1st MPJ.  Skin  normotropic skin with no porokeratosis noted bilaterally.  No signs of infections or ulcers noted.    HAV with ganglion 1st MPJ  Left foot.  IE.  Clinically evaluated his foot to reveal a ganglion on the medial aspect of the first MPJ left foot.  We discussed this condition and he was told to make an appointment with Dr. Logan BoresEvans since the desires permanent removal of this mass.  RTC prn   Helane GuntherGregory Ival Basquez DPM

## 2017-08-12 ENCOUNTER — Ambulatory Visit (INDEPENDENT_AMBULATORY_CARE_PROVIDER_SITE_OTHER): Payer: Medicare Other

## 2017-08-12 ENCOUNTER — Telehealth: Payer: Self-pay | Admitting: Podiatry

## 2017-08-12 ENCOUNTER — Ambulatory Visit (INDEPENDENT_AMBULATORY_CARE_PROVIDER_SITE_OTHER): Payer: Medicare Other | Admitting: Podiatry

## 2017-08-12 ENCOUNTER — Encounter: Payer: Self-pay | Admitting: Podiatry

## 2017-08-12 DIAGNOSIS — M205X1 Other deformities of toe(s) (acquired), right foot: Secondary | ICD-10-CM

## 2017-08-12 DIAGNOSIS — M6740 Ganglion, unspecified site: Secondary | ICD-10-CM | POA: Diagnosis not present

## 2017-08-12 DIAGNOSIS — M205X2 Other deformities of toe(s) (acquired), left foot: Secondary | ICD-10-CM

## 2017-08-12 NOTE — Telephone Encounter (Signed)
Called pt back to let him know we do not have his paperwork to get him scheduled for surgery yet and that once we received that we would call and get him scheduled for surgery. Pt stated Dr. Logan Bentley told him he was booked out 4 weeks and he would like to go ahead and get on the books because his wife is out of work. I tentatively scheduled [name] for surgery on Thursday 05 September. I told him once we got his paperwork which should be by the end of this week we would call him back to confirm his surgery date. Pt asked what time his surgery would be and I told him someone from the surgical center would call 24 - 48 hours before his surgery and let him know the time because it depends on the surgical center's schedule and if they have any kids or diabetics that would need to have surgery first. Pt stated he understood and would wait for us to call him back.

## 2017-08-12 NOTE — Patient Instructions (Signed)
Pre-Operative Instructions  Congratulations, you have decided to take an important step towards improving your quality of life.  You can be assured that the doctors and staff at [facility] will be with you every step of the way.  Here are some important things you should know:  1. Plan to be at the surgery center/hospital at least 1 (one) hour prior to your scheduled time, unless otherwise directed by the surgical center/hospital staff.  You must have a responsible adult accompany you, remain during the surgery and drive you home.  Make sure you have directions to the surgical center/hospital to ensure you arrive on time. 2. If you are having surgery at [facility] or [facility] hospitals, you will need a copy of your medical history and physical form from your family physician within one month prior to the date of surgery. We will give you a form for your primary physician to complete.  3. We make every effort to accommodate the date you request for surgery.  However, there are times where surgery dates or times have to be moved.  We will contact you as soon as possible if a change in schedule is required.   4. No aspirin/ibuprofen for one week before surgery.  If you are on aspirin, any non-steroidal anti-inflammatory medications (Mobic, Aleve, Ibuprofen) should not be taken seven (7) days prior to your surgery.  You make take Tylenol for pain prior to surgery.  5. Medications - If you are taking daily heart and blood pressure medications, seizure, reflux, allergy, asthma, anxiety, pain or diabetes medications, make sure you notify the surgery center/hospital before the day of surgery so they can tell you which medications you should take or avoid the day of surgery. 6. No food or drink after midnight the night before surgery unless directed otherwise by surgical center/hospital staff. 7. No alcoholic beverages 24-hours prior to surgery.  No smoking 24-hours prior or 24-hours after  surgery. 8. Wear loose pants or shorts. They should be loose enough to fit over bandages, boots, and casts. 9. Don't wear slip-on shoes. Sneakers are preferred. 10. Bring your boot with you to the surgery center/hospital.  Also bring crutches or a walker if your physician has prescribed it for you.  If you do not have this equipment, it will be provided for you after surgery. 11. If you have not been contacted by the surgery center/hospital by the day before your surgery, call to confirm the date and time of your surgery. 12. Leave-time from work may vary depending on the type of surgery you have.  Appropriate arrangements should be made prior to surgery with your employer. 13. Prescriptions will be provided immediately following surgery by your doctor.  Fill these as soon as possible after surgery and take the medication as directed. Pain medications will not be refilled on weekends and must be approved by the doctor. 14. Remove nail polish on the operative foot and avoid getting pedicures prior to surgery. 15. Wash the night before surgery.  The night before surgery wash the foot and leg well with water and the antibacterial soap provided. Be sure to pay special attention to beneath the toenails and in between the toes.  Wash for at least three (3) minutes. Rinse thoroughly with water and dry well with a towel.  Perform this wash unless told not to do so by your physician.  Enclosed: 1 Ice pack (please put in freezer the night before surgery)   1 Hibiclens skin cleaner     Pre-op instructions  If you have any questions regarding the instructions, please do not hesitate to call our office.  [place]: [address], [place], [place] [address] -- [phone]  [place]: [address] [place], [place] [address] -- [phone]  [place]: [address]  [place], [place] [address] -- [phone]  [place]: [address], [address], [place], [place] [address] -- [phone]  Website:  [email or web address] 

## 2017-08-12 NOTE — Telephone Encounter (Signed)
I'm calling to schedule surgery with Dr. Logan BoresEvans. You can call me back at [phone]. Thank you.

## 2017-08-15 NOTE — Progress Notes (Signed)
   HPI: 67 year old male presenting today for follow up evaluation of a ganglion cyst of the left medial 1st MPJ. He reports continued pain. He is interested in surgical intervention at this time. Patient is here for further evaluation and treatment.   Past Medical History:  Diagnosis Date  . Hypertension   . Pneumonia   . Porphyria cutanea tarda (HCC)   . SBO (small bowel obstruction) (HCC)      Physical Exam: General: The patient is alert and oriented x3 in no acute distress.  Dermatology: Skin is warm, dry and supple bilateral lower extremities. Negative for open lesions or macerations.  Vascular: Palpable pedal pulses bilaterally. No edema or erythema noted. Capillary refill within normal limits.  Neurological: Epicritic and protective threshold grossly intact bilaterally.   Musculoskeletal Exam: Pain on palpation with limited range of motion noted to the first MPJ of the bilateral feet. Fluctuant mass noted to medial aspect of the 1st MPJ of the left foot.    Assessment: 1. Hallux limitus bilateral  2. Ganglion cyst left medial 1st MPJ   Plan of Care:  1. Patient evaluated.  2. Today we discussed the conservative versus surgical management of the presenting pathology. The patient opts for surgical management. All possible complications and details of the procedure were explained. All patient questions were answered. No guarantees were expressed or implied. 3. Authorization for surgery was initiated today. Surgery will consist of cheilectomy bilateral. Excision of ganglion cyst left foot.  4. Post op shoes dispensed bilateral.  5. Return to clinic one week post op.   Goes by Larry Bentley.       Felecia ShellingBrent M. Savilla Turbyfill, DPM [facility]  Dr. Felecia ShellingBrent M. Zakkary Thibault, DPM    [address]                                        [place], [place] [id number]                Office [phone]  Fax [phone]

## 2017-08-19 ENCOUNTER — Telehealth: Payer: Self-pay | Admitting: Podiatry

## 2017-08-19 NOTE — Telephone Encounter (Signed)
I called and was forwarded to Shanda BumpsJessica to schedule some follow ups to my surgery. My phone number is [phone]. Thank you.

## 2017-08-19 NOTE — Telephone Encounter (Signed)
I called [name] back in regards to the message he left earlier. Pt stated he was supposed to have 2 post op appointments scheduled following his surgery. I told the pt I just got those scheduled this morning. I gave him the dates of 13 September at 1:45 pm and 20 September at

## 2017-08-20 DIAGNOSIS — M955 Acquired deformity of pelvis: Secondary | ICD-10-CM | POA: Diagnosis not present

## 2017-08-20 DIAGNOSIS — M9903 Segmental and somatic dysfunction of lumbar region: Secondary | ICD-10-CM | POA: Diagnosis not present

## 2017-08-20 DIAGNOSIS — M5416 Radiculopathy, lumbar region: Secondary | ICD-10-CM | POA: Diagnosis not present

## 2017-08-20 DIAGNOSIS — M9905 Segmental and somatic dysfunction of pelvic region: Secondary | ICD-10-CM | POA: Diagnosis not present

## 2017-09-10 DIAGNOSIS — M955 Acquired deformity of pelvis: Secondary | ICD-10-CM | POA: Diagnosis not present

## 2017-09-10 DIAGNOSIS — M5416 Radiculopathy, lumbar region: Secondary | ICD-10-CM | POA: Diagnosis not present

## 2017-09-10 DIAGNOSIS — M9903 Segmental and somatic dysfunction of lumbar region: Secondary | ICD-10-CM | POA: Diagnosis not present

## 2017-09-10 DIAGNOSIS — M9905 Segmental and somatic dysfunction of pelvic region: Secondary | ICD-10-CM | POA: Diagnosis not present

## 2017-09-11 ENCOUNTER — Telehealth: Payer: Self-pay | Admitting: Podiatry

## 2017-09-11 ENCOUNTER — Encounter: Payer: Self-pay | Admitting: Podiatry

## 2017-09-11 ENCOUNTER — Telehealth: Payer: Self-pay

## 2017-09-11 DIAGNOSIS — M67472 Ganglion, left ankle and foot: Secondary | ICD-10-CM | POA: Diagnosis not present

## 2017-09-11 DIAGNOSIS — M205X2 Other deformities of toe(s) (acquired), left foot: Secondary | ICD-10-CM | POA: Diagnosis not present

## 2017-09-11 DIAGNOSIS — R0683 Snoring: Secondary | ICD-10-CM | POA: Diagnosis not present

## 2017-09-11 DIAGNOSIS — M205X1 Other deformities of toe(s) (acquired), right foot: Secondary | ICD-10-CM | POA: Diagnosis not present

## 2017-09-11 DIAGNOSIS — M2022 Hallux rigidus, left foot: Secondary | ICD-10-CM | POA: Diagnosis not present

## 2017-09-11 MED ORDER — OXYCODONE-ACETAMINOPHEN 5-325 MG PO TABS: 1.0000 | ORAL_TABLET | Freq: Four times a day (QID) | ORAL | 0 refills | Status: DC | PRN

## 2017-09-11 NOTE — Telephone Encounter (Signed)
Per Dr. Logan Bores, ok to print out script for Percocet 5/325  Script has been printed and left at front desk for patient pick up

## 2017-09-11 NOTE — Telephone Encounter (Signed)
Pt states he gave the prescription to the pharmacy, I told him I would call the pharmacy to get the prescription information and call again.

## 2017-09-11 NOTE — Telephone Encounter (Signed)
Patient called because [facility] told him that Dr. Logan Bores license has expired and they could not fill his RX.

## 2017-09-11 NOTE — Telephone Encounter (Signed)
[name] - [facility] [phone] states pt was percocet 5/325mg  #30 one tablet every 6 hours prn foot pain. I told [name], I would have pt pick up a hard copy rx in the [place] office.

## 2017-09-11 NOTE — Telephone Encounter (Signed)
Routed and skyped message to A. Glynda Jaeger, LPN to write rx for Percocet as ordered by Dr. Logan Bores. I informed pt he could send someone to the [place] office to pick up the rx.

## 2017-09-12 DIAGNOSIS — Z4789 Encounter for other orthopedic aftercare: Secondary | ICD-10-CM | POA: Diagnosis not present

## 2017-09-12 DIAGNOSIS — M6281 Muscle weakness (generalized): Secondary | ICD-10-CM | POA: Diagnosis not present

## 2017-09-12 DIAGNOSIS — R2689 Other abnormalities of gait and mobility: Secondary | ICD-10-CM | POA: Diagnosis not present

## 2017-09-15 ENCOUNTER — Telehealth: Payer: Self-pay | Admitting: Podiatry

## 2017-09-15 NOTE — Telephone Encounter (Signed)
Pt stated the [facility] was suppose to come out and change his bandages but hasn't shown up since he has had his surgery. Please give him a call back ASAP.

## 2017-09-15 NOTE — Telephone Encounter (Signed)
I returned patient call.  He stated that Home health came out but it was a physical therapist and not the nurse.  I told him that I would check on this and call him back and let him know what was going on.

## 2017-09-16 ENCOUNTER — Telehealth: Payer: Self-pay | Admitting: Podiatry

## 2017-09-16 DIAGNOSIS — R2689 Other abnormalities of gait and mobility: Secondary | ICD-10-CM | POA: Diagnosis not present

## 2017-09-16 DIAGNOSIS — M6281 Muscle weakness (generalized): Secondary | ICD-10-CM | POA: Diagnosis not present

## 2017-09-16 DIAGNOSIS — Z4789 Encounter for other orthopedic aftercare: Secondary | ICD-10-CM | POA: Diagnosis not present

## 2017-09-16 NOTE — Telephone Encounter (Signed)
Left message informing Marchelle Folks - [facility] that if the blood was dry the dressing needed to stay intact until seen in office on Friday, but if wet then pt needed to be seen sooner.

## 2017-09-16 NOTE — Telephone Encounter (Signed)
This is Marchelle Folks, Charity fundraiser with [facility]. I saw Mr. Clift today to do his nursing assessment since we picked him up after his surgery. I wanted to let the nurse know that there is bloody dry drainage that extends from the top of his toe to the underside of his left foot near the incision line. I just wanted to clarify in between his visit that he has this Friday to do a dressing change at home or if the dressing needs to remain intact until Mr. Norell sees the doctor in the office this Friday? You can call me back at [phone]. Thank you.

## 2017-09-17 DIAGNOSIS — M6281 Muscle weakness (generalized): Secondary | ICD-10-CM | POA: Diagnosis not present

## 2017-09-17 DIAGNOSIS — R2689 Other abnormalities of gait and mobility: Secondary | ICD-10-CM | POA: Diagnosis not present

## 2017-09-17 DIAGNOSIS — Z4789 Encounter for other orthopedic aftercare: Secondary | ICD-10-CM | POA: Diagnosis not present

## 2017-09-18 ENCOUNTER — Encounter: Payer: Self-pay | Admitting: Podiatry

## 2017-09-19 ENCOUNTER — Encounter: Payer: Self-pay | Admitting: Podiatry

## 2017-09-19 ENCOUNTER — Ambulatory Visit (INDEPENDENT_AMBULATORY_CARE_PROVIDER_SITE_OTHER): Payer: Medicare Other

## 2017-09-19 ENCOUNTER — Other Ambulatory Visit: Payer: Self-pay | Admitting: Podiatry

## 2017-09-19 ENCOUNTER — Ambulatory Visit (INDEPENDENT_AMBULATORY_CARE_PROVIDER_SITE_OTHER): Payer: Medicare Other | Admitting: Podiatry

## 2017-09-19 ENCOUNTER — Encounter: Payer: Medicare Other | Admitting: Podiatry

## 2017-09-19 VITALS — BP 145/70 | HR 61 | Temp 98.5°F

## 2017-09-19 DIAGNOSIS — M79672 Pain in left foot: Secondary | ICD-10-CM

## 2017-09-19 DIAGNOSIS — Z4789 Encounter for other orthopedic aftercare: Secondary | ICD-10-CM | POA: Diagnosis not present

## 2017-09-19 DIAGNOSIS — M205X1 Other deformities of toe(s) (acquired), right foot: Secondary | ICD-10-CM | POA: Diagnosis not present

## 2017-09-19 DIAGNOSIS — Z9889 Other specified postprocedural states: Secondary | ICD-10-CM

## 2017-09-19 DIAGNOSIS — M205X2 Other deformities of toe(s) (acquired), left foot: Secondary | ICD-10-CM | POA: Diagnosis not present

## 2017-09-19 DIAGNOSIS — M79671 Pain in right foot: Secondary | ICD-10-CM

## 2017-09-19 DIAGNOSIS — M6281 Muscle weakness (generalized): Secondary | ICD-10-CM | POA: Diagnosis not present

## 2017-09-19 DIAGNOSIS — R2689 Other abnormalities of gait and mobility: Secondary | ICD-10-CM | POA: Diagnosis not present

## 2017-09-19 NOTE — Progress Notes (Signed)
Subjective:   Patient ID: Larry Bentley, male   DOB: 67 y.o.   MRN: [id number]   HPI Patient presents stating doing well with minimal discomfort and able to walk without pain currently   ROS      Objective:  Physical Exam  Neurovascular status intact negative Homans sign noted with wound edges first metatarsal healing well with staples in place good alignment and range of motion noted.  Patient has mild redness and swelling consistent with this.  Postop     Assessment:  Doing well post first metatarsal surgery bilateral     Plan:  X-rays reviewed and at this point we applied sterile dressing advised on continued elevation compression immobilization and reappoint to recheck again in the next week by Dr. Logan BoresEvans or earlier if any issues should occur  X-rays indicate excellent resection of bone with  staples in place

## 2017-09-22 DIAGNOSIS — R2689 Other abnormalities of gait and mobility: Secondary | ICD-10-CM | POA: Diagnosis not present

## 2017-09-22 DIAGNOSIS — Z4789 Encounter for other orthopedic aftercare: Secondary | ICD-10-CM | POA: Diagnosis not present

## 2017-09-22 DIAGNOSIS — M6281 Muscle weakness (generalized): Secondary | ICD-10-CM | POA: Diagnosis not present

## 2017-09-24 DIAGNOSIS — M6281 Muscle weakness (generalized): Secondary | ICD-10-CM | POA: Diagnosis not present

## 2017-09-24 DIAGNOSIS — R2689 Other abnormalities of gait and mobility: Secondary | ICD-10-CM | POA: Diagnosis not present

## 2017-09-24 DIAGNOSIS — Z4789 Encounter for other orthopedic aftercare: Secondary | ICD-10-CM | POA: Diagnosis not present

## 2017-09-26 ENCOUNTER — Encounter: Payer: Self-pay | Admitting: Podiatry

## 2017-09-26 ENCOUNTER — Ambulatory Visit (INDEPENDENT_AMBULATORY_CARE_PROVIDER_SITE_OTHER): Payer: Medicare Other | Admitting: Podiatry

## 2017-09-26 DIAGNOSIS — M205X2 Other deformities of toe(s) (acquired), left foot: Secondary | ICD-10-CM

## 2017-09-26 DIAGNOSIS — M205X1 Other deformities of toe(s) (acquired), right foot: Secondary | ICD-10-CM

## 2017-09-26 DIAGNOSIS — Z9889 Other specified postprocedural states: Secondary | ICD-10-CM

## 2017-09-26 MED ORDER — DOXYCYCLINE HYCLATE 100 MG PO TABS: 100.0000 mg | ORAL_TABLET | Freq: Two times a day (BID) | ORAL | 0 refills | Status: DC

## 2017-09-29 NOTE — Progress Notes (Signed)
   Subjective:  Patient presents today status post cheilectomy bilateral and excision of ganglion cyst of the left foot. DOS: 09/11/17. He states he is doing well overall. He denies any significant pain or modifying factors. He has been using the post op shoes without issue. Patient is here for further evaluation and treatment.    Past Medical History:  Diagnosis Date  . Hypertension   . Pneumonia   . Porphyria cutanea tarda (HCC)   . SBO (small bowel obstruction) (HCC)       Objective/Physical Exam Neurovascular status intact.  Skin incisions appear to be well coapted with sutures and staples intact. No sign of infectious process noted. No dehiscence. No active bleeding noted. Moderate edema noted to the surgical extremity.  Assessment: 1. s/p cheilectomy bilateral. DOS: 09/11/17   Plan of Care:  1. Patient was evaluated.  2. Staples removed. Dry sterile dressing applied.  3. Continue weightbearing in post op shoes bilateral.  4. Prescription for Doxycycline #20 for prophylaxis provided to patient.  5. Return to clinic in 2 weeks.    Felecia ShellingBrent M. Jaleea Alesi, DPM [facility]  Dr. Felecia ShellingBrent M. Joclyn Alsobrook, DPM    [address]                                        [place], [place] [address]                Office [phone]  Fax [phone]

## 2017-10-01 DIAGNOSIS — M6281 Muscle weakness (generalized): Secondary | ICD-10-CM | POA: Diagnosis not present

## 2017-10-01 DIAGNOSIS — Z4789 Encounter for other orthopedic aftercare: Secondary | ICD-10-CM | POA: Diagnosis not present

## 2017-10-01 DIAGNOSIS — R2689 Other abnormalities of gait and mobility: Secondary | ICD-10-CM | POA: Diagnosis not present

## 2017-10-07 ENCOUNTER — Other Ambulatory Visit: Payer: Self-pay

## 2017-10-07 ENCOUNTER — Encounter: Payer: Self-pay | Admitting: Podiatry

## 2017-10-07 MED ORDER — MELOXICAM 15 MG PO TABS: 15.0000 mg | ORAL_TABLET | Freq: Every day | ORAL | 3 refills | Status: DC

## 2017-10-07 NOTE — Telephone Encounter (Signed)
Pharmacy refill request for Meloxicam 15mg   Per Dr. Evans verbal order, ok to refill.   Script has been sent to pharmacy 

## 2017-10-08 DIAGNOSIS — M9903 Segmental and somatic dysfunction of lumbar region: Secondary | ICD-10-CM | POA: Diagnosis not present

## 2017-10-08 DIAGNOSIS — M9905 Segmental and somatic dysfunction of pelvic region: Secondary | ICD-10-CM | POA: Diagnosis not present

## 2017-10-08 DIAGNOSIS — M955 Acquired deformity of pelvis: Secondary | ICD-10-CM | POA: Diagnosis not present

## 2017-10-08 DIAGNOSIS — M5416 Radiculopathy, lumbar region: Secondary | ICD-10-CM | POA: Diagnosis not present

## 2017-10-17 ENCOUNTER — Encounter: Payer: Self-pay | Admitting: Podiatry

## 2017-10-17 ENCOUNTER — Ambulatory Visit (INDEPENDENT_AMBULATORY_CARE_PROVIDER_SITE_OTHER): Payer: Medicare Other | Admitting: Podiatry

## 2017-10-17 DIAGNOSIS — M6740 Ganglion, unspecified site: Secondary | ICD-10-CM

## 2017-10-17 DIAGNOSIS — M205X2 Other deformities of toe(s) (acquired), left foot: Secondary | ICD-10-CM

## 2017-10-17 DIAGNOSIS — Z9889 Other specified postprocedural states: Secondary | ICD-10-CM

## 2017-10-17 DIAGNOSIS — M205X1 Other deformities of toe(s) (acquired), right foot: Secondary | ICD-10-CM

## 2017-10-21 NOTE — Progress Notes (Signed)
   Subjective:  Patient presents today status post cheilectomy bilateral and excision of ganglion cyst of the left foot. DOS: 09/11/17. He denies any pain or modifying factors. He reports some skin peeling around the incision site. He denies any new complaints at this time. Patient is here for further evaluation and treatment.    Past Medical History:  Diagnosis Date  . Hypertension   . Pneumonia   . Porphyria cutanea tarda (HCC)   . SBO (small bowel obstruction) (HCC)       Objective/Physical Exam Neurovascular status intact.  Skin incisions appear to be well coapted. No sign of infectious process noted. No dehiscence. No active bleeding noted. Moderate edema noted to the surgical extremity.  Assessment: 1. s/p cheilectomy bilateral. DOS: 09/11/17   Plan of Care:  1. Patient was evaluated.  2. May resume full activity with no restrictions.  3. Recommended good shoe gear.  4. Return to clinic as needed.     Felecia Shelling, DPM [facility]  Dr. Felecia Shelling, DPM    [address]                                        [place], [place] [address]                Office [phone]  Fax [phone]

## 2017-11-05 DIAGNOSIS — M5416 Radiculopathy, lumbar region: Secondary | ICD-10-CM | POA: Diagnosis not present

## 2017-11-05 DIAGNOSIS — M955 Acquired deformity of pelvis: Secondary | ICD-10-CM | POA: Diagnosis not present

## 2017-11-05 DIAGNOSIS — M9905 Segmental and somatic dysfunction of pelvic region: Secondary | ICD-10-CM | POA: Diagnosis not present

## 2017-11-05 DIAGNOSIS — M9903 Segmental and somatic dysfunction of lumbar region: Secondary | ICD-10-CM | POA: Diagnosis not present

## 2017-12-02 DIAGNOSIS — M5416 Radiculopathy, lumbar region: Secondary | ICD-10-CM | POA: Diagnosis not present

## 2017-12-02 DIAGNOSIS — M955 Acquired deformity of pelvis: Secondary | ICD-10-CM | POA: Diagnosis not present

## 2017-12-02 DIAGNOSIS — M9903 Segmental and somatic dysfunction of lumbar region: Secondary | ICD-10-CM | POA: Diagnosis not present

## 2017-12-02 DIAGNOSIS — M9905 Segmental and somatic dysfunction of pelvic region: Secondary | ICD-10-CM | POA: Diagnosis not present

## 2017-12-29 DIAGNOSIS — M5416 Radiculopathy, lumbar region: Secondary | ICD-10-CM | POA: Diagnosis not present

## 2017-12-29 DIAGNOSIS — M955 Acquired deformity of pelvis: Secondary | ICD-10-CM | POA: Diagnosis not present

## 2017-12-29 DIAGNOSIS — M9903 Segmental and somatic dysfunction of lumbar region: Secondary | ICD-10-CM | POA: Diagnosis not present

## 2017-12-29 DIAGNOSIS — M9905 Segmental and somatic dysfunction of pelvic region: Secondary | ICD-10-CM | POA: Diagnosis not present

## 2018-01-26 DIAGNOSIS — M9903 Segmental and somatic dysfunction of lumbar region: Secondary | ICD-10-CM | POA: Diagnosis not present

## 2018-01-26 DIAGNOSIS — M5416 Radiculopathy, lumbar region: Secondary | ICD-10-CM | POA: Diagnosis not present

## 2018-01-26 DIAGNOSIS — M9905 Segmental and somatic dysfunction of pelvic region: Secondary | ICD-10-CM | POA: Diagnosis not present

## 2018-01-26 DIAGNOSIS — M955 Acquired deformity of pelvis: Secondary | ICD-10-CM | POA: Diagnosis not present

## 2018-01-27 DIAGNOSIS — N529 Male erectile dysfunction, unspecified: Secondary | ICD-10-CM

## 2018-02-23 DIAGNOSIS — M5416 Radiculopathy, lumbar region: Secondary | ICD-10-CM | POA: Diagnosis not present

## 2018-02-23 DIAGNOSIS — M955 Acquired deformity of pelvis: Secondary | ICD-10-CM | POA: Diagnosis not present

## 2018-02-23 DIAGNOSIS — M9903 Segmental and somatic dysfunction of lumbar region: Secondary | ICD-10-CM | POA: Diagnosis not present

## 2018-02-23 DIAGNOSIS — M9905 Segmental and somatic dysfunction of pelvic region: Secondary | ICD-10-CM | POA: Diagnosis not present

## 2018-03-04 ENCOUNTER — Encounter: Payer: Self-pay | Admitting: Urology

## 2018-03-04 ENCOUNTER — Ambulatory Visit (INDEPENDENT_AMBULATORY_CARE_PROVIDER_SITE_OTHER): Payer: Medicare Other | Admitting: Urology

## 2018-03-04 VITALS — BP 167/76 | HR 76 | Ht 72.0 in | Wt 205.0 lb

## 2018-03-04 DIAGNOSIS — N529 Male erectile dysfunction, unspecified: Secondary | ICD-10-CM | POA: Diagnosis not present

## 2018-03-04 MED ORDER — TADALAFIL 20 MG PO TABS: ORAL_TABLET | ORAL | 2 refills | Status: DC

## 2018-03-04 NOTE — Progress Notes (Signed)
03/04/2018 10:36 AM   Larry Bentley 25-May-1950 [id number]  Referring provider: Doreene Nest, NP [address] [place], [place] [address]  Chief Complaint  Patient presents with  . Erectile Dysfunction    HPI: Larry Bentley is a 68 year old male seen at the request of Vernona Rieger, NP for evaluation of erectile dysfunction.  He presents with a 6-8-year history of progressively worsening ED.  He currently has partial erections which are firm enough for penetration much less than 50% of the time.  The times he is able to achieve penetration he has difficulty maintaining the erection.  He denies pain or curvature with erections.  SHIM was 10/25 indicating moderate ED.  No prior treatment.  He states his former PCP in [place] diagnosed him with a low T and elevated estrogen levels and was placed on a supplement medication.  He had testosterone and estrogen levels checked June 2019 which were normal.  A PSA in July 2019 was 0.39.  He has no significant organic risk factors and denies hypertension, antihypertensive medications or tobacco history.   PMH: Past Medical History:  Diagnosis Date  . Hypertension   . Pneumonia   . Porphyria cutanea tarda (HCC)   . SBO (small bowel obstruction) [facility])     Surgical History: Past Surgical History:  Procedure Laterality Date  . HEMORROIDECTOMY  08/11/2014    Home Medications:  Allergies as of 03/04/2018   No Known Allergies     Medication List       Accurate as of March 04, 2018 10:36 AM. Always use your most recent med list.        DENTA 5000 PLUS 1.1 % Crea dental cream Generic drug:  sodium fluoride USE TO BRUSH TEETH 1-2 TIMES PER DAY. NEITHER BRAND NOR GENERIC COVERED   L-ARGININE-500 PO Take by mouth.   MIRALAX PO Take by mouth.   OVER THE COUNTER MEDICATION Metagenics Men's vitality vitamin  Taking 1 packet by mouth once daily   OVER THE COUNTER MEDICATION Met[employer]ics Testralin  Taking 1 tablet by mouth  once daily   OVER THE COUNTER MEDICATION Met[employer]ics Methyl Care  Taking 1 capsule by mouth two times a day   OVER THE COUNTER MEDICATION Met[employer]ics Sinuplex  Taking 1 tablet by mouth once daily   OVER THE COUNTER MEDICATION [employer] Chapter Zyflamend  Taking 1 capsule by mouth two times a day       Allergies: No Known Allergies  Family History: Family History  Problem Relation Age of Onset  . Arthritis Mother   . Heart disease Mother   . Heart disease Father   . Hyperlipidemia Father   . Hypertension Father   . Cancer Maternal Grandfather        brain  . Stroke Paternal Grandmother   . Cancer Paternal Grandfather        lung    Social History:  reports that he quit smoking about 31 years ago. He has never used smokeless tobacco. He reports current alcohol use. He reports previous drug use.  ROS: UROLOGY Frequent Urination?: No Hard to postpone urination?: No Burning/pain with urination?: No Get up at night to urinate?: No Leakage of urine?: No Urine stream starts and stops?: No Trouble starting stream?: No Do you have to strain to urinate?: No Blood in urine?: No Urinary tract infection?: No Sexually transmitted disease?: No Injury to kidneys or bladder?: No Painful intercourse?: No Weak stream?: No Erection problems?: Yes Penile pain?: No  Gastrointestinal  Nausea?: No Vomiting?: No Indigestion/heartburn?: No Diarrhea?: No Constipation?: No  Constitutional Fever: No Night sweats?: No Weight loss?: No Fatigue?: No  Skin Skin rash/lesions?: No Itching?: No  Eyes Blurred vision?: No Double vision?: No  Ears/Nose/Throat Sore throat?: No Sinus problems?: No  Hematologic/Lymphatic Swollen glands?: No Easy bruising?: No  Cardiovascular Leg swelling?: No Chest pain?: No  Respiratory Cough?: No Shortness of breath?: No  Endocrine Excessive thirst?: No  Musculoskeletal Back pain?: No Joint pain?: No  Neurological Headaches?:  No Dizziness?: No  Psychologic Depression?: No Anxiety?: No  Physical Exam: BP (!) 167/76   Pulse 76   Ht 6' (1.829 m)   Wt 205 lb (93 kg)   BMI 27.80 kg/m   Constitutional:  Alert and oriented, No acute distress. HEENT: [place] AT, moist mucus membranes.  Trachea midline, no masses. Cardiovascular: No clubbing, cyanosis, or edema. Respiratory: Normal respiratory effort, no increased work of breathing. GI: Abdomen is soft, nontender, nondistended, no abdominal masses GU: No CVA tenderness.  Penis circumcised without lesions or plaques.  Testes descended bilateral without masses or tenderness.  No paratesticular abnormalities. Lymph: No cervical or inguinal lymphadenopathy. Skin: No rashes, bruises or suspicious lesions. Neurologic: Grossly intact, no focal deficits, moving all 4 extremities. Psychiatric: Normal mood and affect.  Laboratory Data:  Lab Results  Component Value Date   CREATININE 0.95 07/18/2017    Lab Results  Component Value Date   PSA 0.39 07/18/2017    Assessment & Plan:   68 year old male with moderate erectile dysfunction.  No significant organic risk factors.  He is PDE 5 nave and was interested in a trial.  Rx generic tadalafil was [facility] to [facility] and he will utilize a good Rx coupon.  We also discussed other options and he was provided literature on vacuum erection devices and intracavernosal injection therapy.  Riki Altes, MD  [facility] [address] [place], [place] [address] [phone]

## 2018-03-05 ENCOUNTER — Encounter: Payer: Self-pay | Admitting: Urology

## 2018-03-20 DIAGNOSIS — M5416 Radiculopathy, lumbar region: Secondary | ICD-10-CM | POA: Diagnosis not present

## 2018-03-20 DIAGNOSIS — M9905 Segmental and somatic dysfunction of pelvic region: Secondary | ICD-10-CM | POA: Diagnosis not present

## 2018-03-20 DIAGNOSIS — M955 Acquired deformity of pelvis: Secondary | ICD-10-CM | POA: Diagnosis not present

## 2018-03-20 DIAGNOSIS — M9903 Segmental and somatic dysfunction of lumbar region: Secondary | ICD-10-CM | POA: Diagnosis not present

## 2018-06-25 DIAGNOSIS — M955 Acquired deformity of pelvis: Secondary | ICD-10-CM | POA: Diagnosis not present

## 2018-06-25 DIAGNOSIS — M9903 Segmental and somatic dysfunction of lumbar region: Secondary | ICD-10-CM | POA: Diagnosis not present

## 2018-06-25 DIAGNOSIS — M9905 Segmental and somatic dysfunction of pelvic region: Secondary | ICD-10-CM | POA: Diagnosis not present

## 2018-06-25 DIAGNOSIS — M5416 Radiculopathy, lumbar region: Secondary | ICD-10-CM | POA: Diagnosis not present

## 2018-07-23 DIAGNOSIS — M9905 Segmental and somatic dysfunction of pelvic region: Secondary | ICD-10-CM | POA: Diagnosis not present

## 2018-07-23 DIAGNOSIS — M955 Acquired deformity of pelvis: Secondary | ICD-10-CM | POA: Diagnosis not present

## 2018-07-23 DIAGNOSIS — M9903 Segmental and somatic dysfunction of lumbar region: Secondary | ICD-10-CM | POA: Diagnosis not present

## 2018-07-23 DIAGNOSIS — M5416 Radiculopathy, lumbar region: Secondary | ICD-10-CM | POA: Diagnosis not present

## 2018-07-28 IMAGING — DX DG FOOT COMPLETE 3+V*L*
3 series · 3 of 3 positions shown · non-contrast
Comparison: None.

CLINICAL DATA: Palpable abnormality at the first MTP joint

EXAM:
LEFT FOOT - COMPLETE 3+ VIEW

[foot ap]
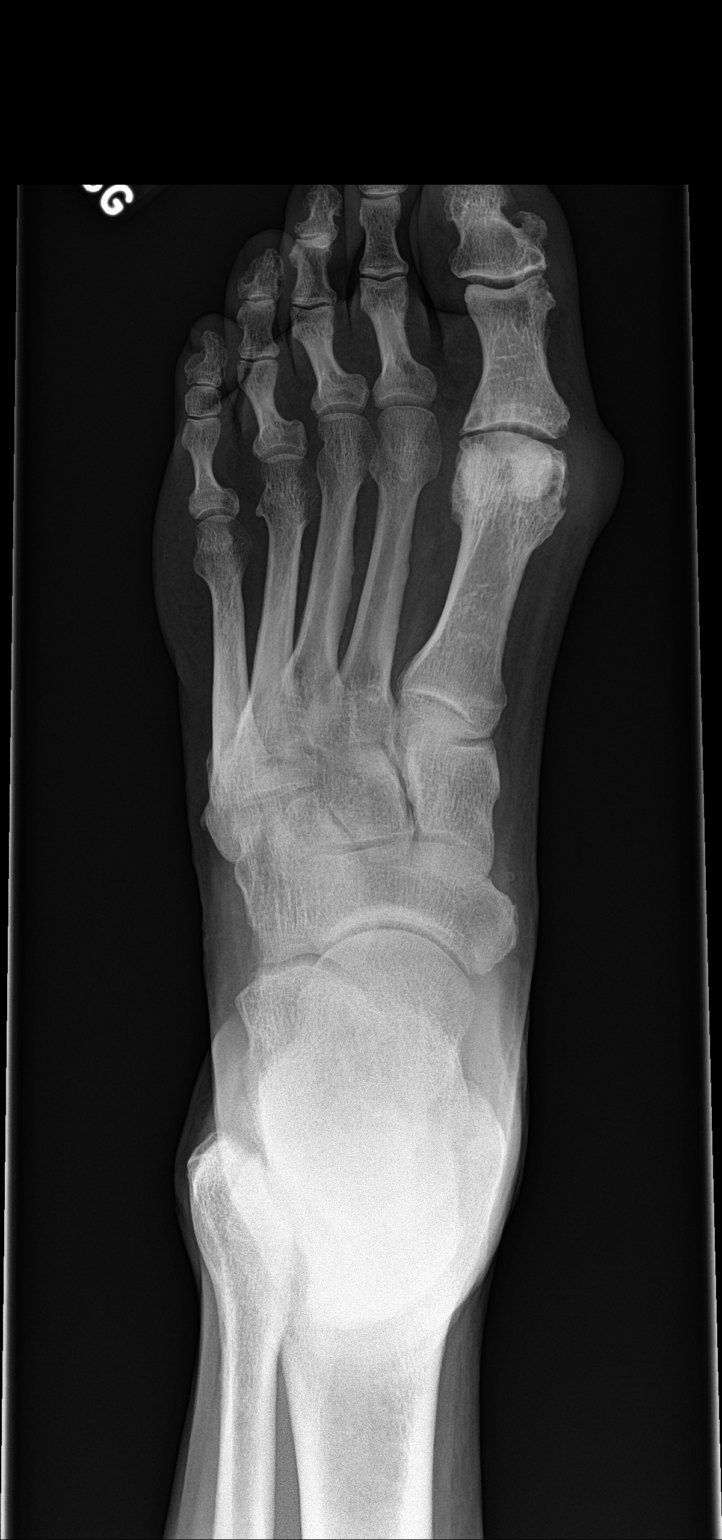

[foot obl]
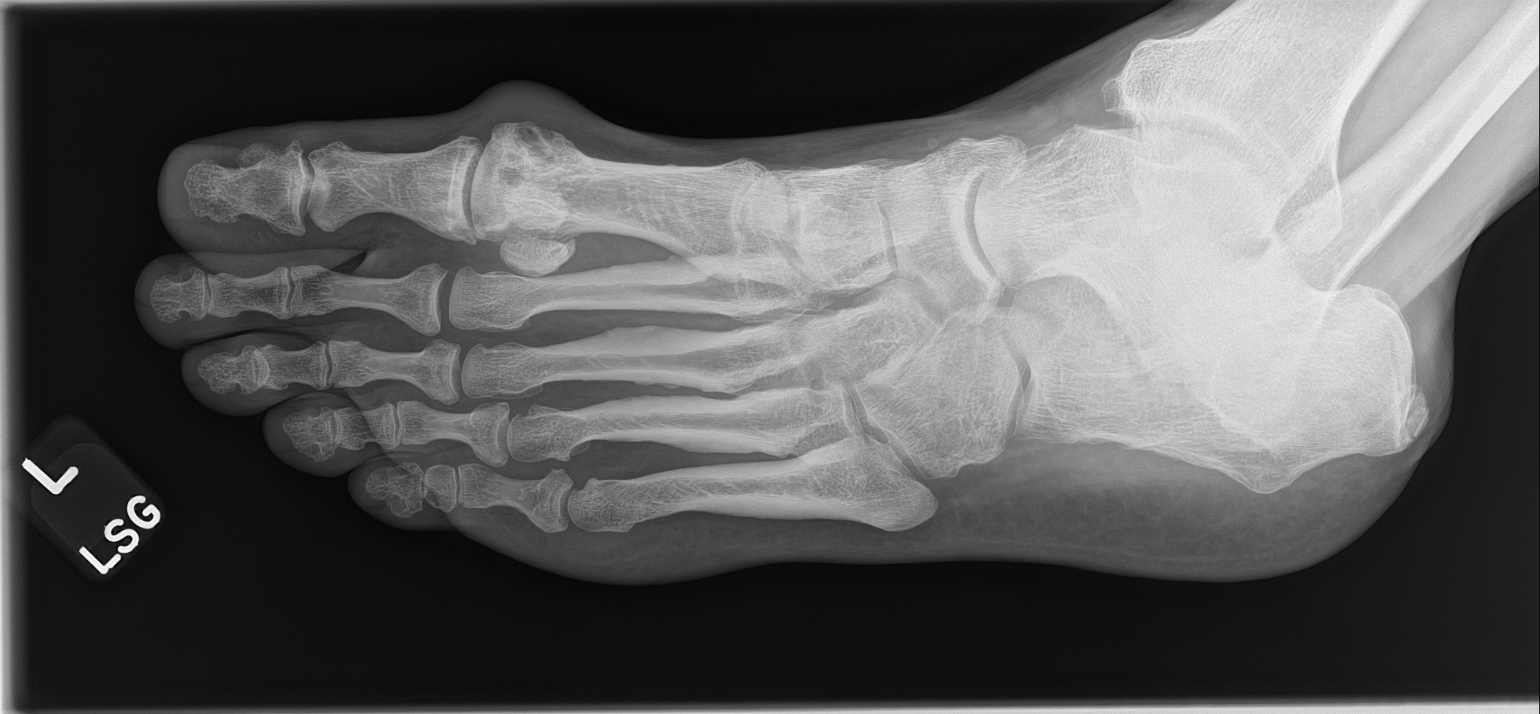

[foot lat]
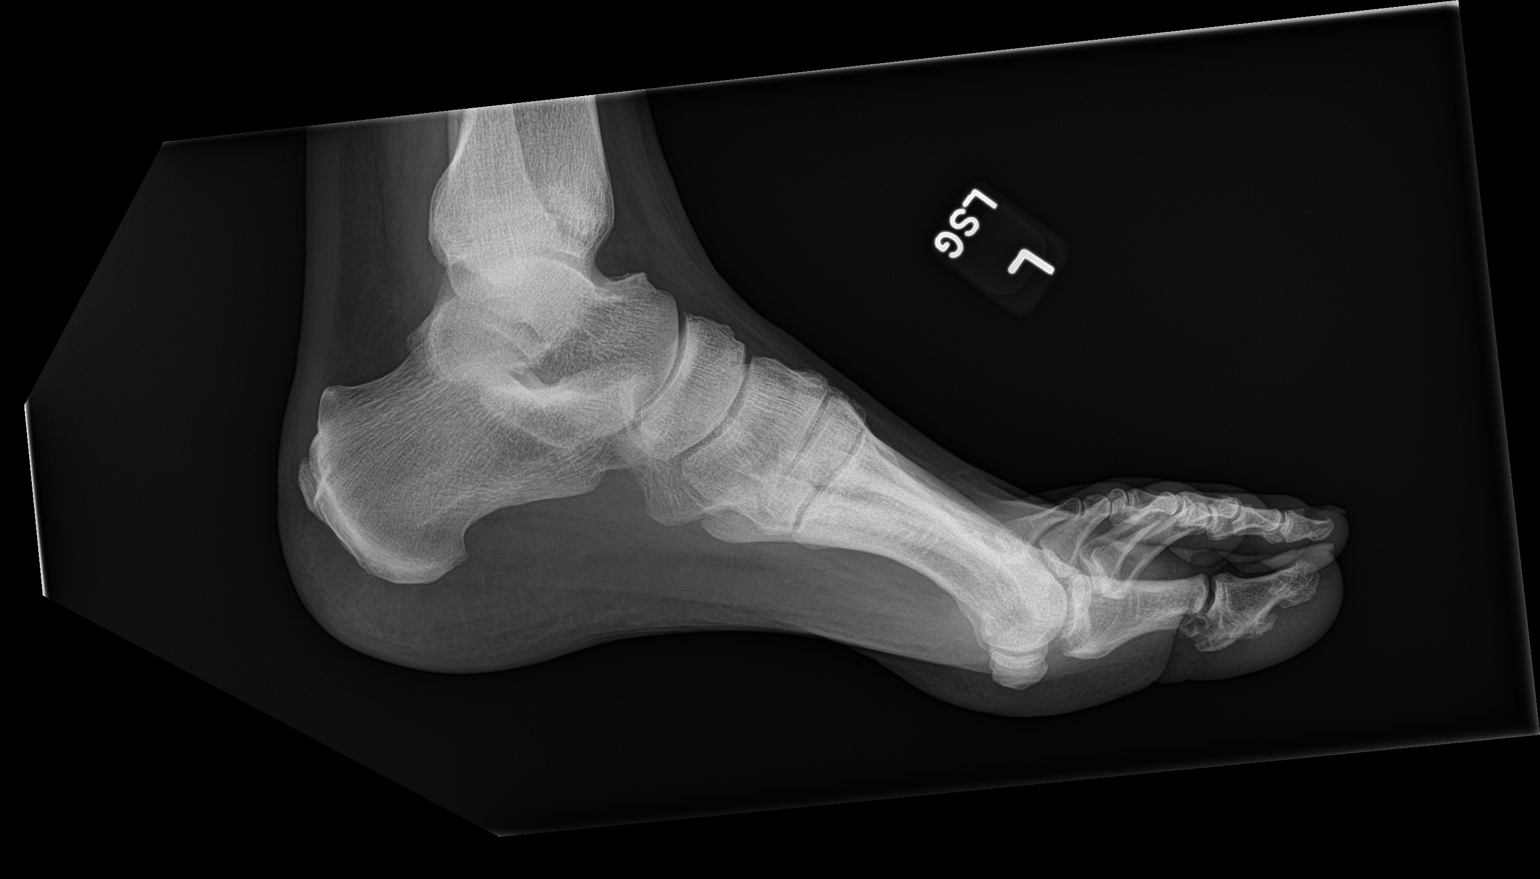

[3 of 3 positions shown; findings below may reference images not displayed]

FINDINGS: Mild degenerative changes of the first MTP joint are noted. Focal
erosions are noted medially. Overlying soft tissue prominence is
noted consistent with the degenerative change. No calcifications are
noted although this could represent a gouty tophus. No other focal
abnormality is noted.
IMPRESSION: Degenerative changes of the first MTP joint with overlying soft
tissue swelling suggestive of gout.

## 2018-07-30 ENCOUNTER — Other Ambulatory Visit: Payer: Self-pay | Admitting: Primary Care

## 2018-07-30 DIAGNOSIS — E785 Hyperlipidemia, unspecified: Secondary | ICD-10-CM

## 2018-08-03 ENCOUNTER — Other Ambulatory Visit (INDEPENDENT_AMBULATORY_CARE_PROVIDER_SITE_OTHER): Payer: Medicare Other

## 2018-08-03 DIAGNOSIS — E785 Hyperlipidemia, unspecified: Secondary | ICD-10-CM

## 2018-08-03 LAB — COMPREHENSIVE METABOLIC PANEL
ALT: 13 U/L (ref 0–53)
AST: 19 U/L (ref 0–37)
Albumin: 4.6 g/dL (ref 3.5–5.2)
Alkaline Phosphatase: 41 U/L (ref 39–117)
BUN: 17 mg/dL (ref 6–23)
CO2: 28 mEq/L (ref 19–32)
Calcium: 9.3 mg/dL (ref 8.4–10.5)
Chloride: 105 mEq/L (ref 96–112)
Creatinine, Ser: 0.93 mg/dL (ref 0.40–1.50)
GFR: 80.56 mL/min (ref >60.00)
Glucose, Bld: 101 mg/dL — ABNORMAL HIGH (ref 70–99)
Potassium: 3.7 mEq/L (ref 3.5–5.1)
Sodium: 141 mEq/L (ref 135–145)
Total Bilirubin: 0.8 mg/dL (ref 0.2–1.2)
Total Protein: 6.6 g/dL (ref 6.0–8.3)

## 2018-08-03 LAB — LIPID PANEL
Cholesterol: 175 mg/dL (ref 0–200)
HDL: 48.6 mg/dL (ref >39.00)
LDL Cholesterol: 106 mg/dL — ABNORMAL HIGH (ref 0–99)
NonHDL: 126.65
Total CHOL/HDL Ratio: 4
Triglycerides: 101 mg/dL (ref 0.0–149.0)
VLDL: 20.2 mg/dL (ref 0.0–40.0)

## 2018-08-05 ENCOUNTER — Other Ambulatory Visit: Payer: Self-pay

## 2018-08-05 ENCOUNTER — Encounter: Payer: Self-pay | Admitting: Primary Care

## 2018-08-05 ENCOUNTER — Ambulatory Visit (INDEPENDENT_AMBULATORY_CARE_PROVIDER_SITE_OTHER): Payer: Medicare Other | Admitting: Primary Care

## 2018-08-05 ENCOUNTER — Ambulatory Visit: Payer: Medicare Other

## 2018-08-05 VITALS — BP 140/70 | HR 63 | Temp 98.2°F | Ht 71.75 in | Wt 202.8 lb

## 2018-08-05 DIAGNOSIS — N529 Male erectile dysfunction, unspecified: Secondary | ICD-10-CM

## 2018-08-05 DIAGNOSIS — Z23 Encounter for immunization: Secondary | ICD-10-CM | POA: Diagnosis not present

## 2018-08-05 DIAGNOSIS — R739 Hyperglycemia, unspecified: Secondary | ICD-10-CM | POA: Diagnosis not present

## 2018-08-05 DIAGNOSIS — Z0000 Encounter for general adult medical examination without abnormal findings: Secondary | ICD-10-CM | POA: Diagnosis not present

## 2018-08-05 LAB — POCT GLYCOSYLATED HEMOGLOBIN (HGB A1C): Hemoglobin A1C: 4.9 % (ref 4.0–5.6)

## 2018-08-05 NOTE — Assessment & Plan Note (Signed)
Fasting glucose reading of 101 on recent labs. A1C pending.

## 2018-08-05 NOTE — Assessment & Plan Note (Addendum)
Due for pneumovax, provided today. Declines Shingrix due to cost. Declines tetanus due to cost. PSA UTD, following with Urology. Colonoscopy UTD, due in 2026. Exam unremarkable. Labs reviewed. Follow up in 1 year.  I have personally reviewed and have noted: 1. The patient's medical and social history 2. Their use of alcohol, tobacco or illicit drugs 3. Their current medications and supplements 4. The patient's functional ability including ADL's, fall risks, home  safety risks and hearing or visual impairment. 5. Diet and physical activities 6. Evidence for depression or mood disorder   All recommendations provided at end of visit.

## 2018-08-05 NOTE — Progress Notes (Signed)
Patient ID: Larry Bentley, male   DOB: [birth year], 68 y.o.   MRN: [id number]  [name] is a 68 year old male who presents today for MWV.  HPI:  Past Medical History:  Diagnosis Date  . Hypertension   . Pneumonia   . Porphyria cutanea tarda (HCC)   . SBO (small bowel obstruction) (HCC)     Current Outpatient Medications  Medication Sig Dispense Refill  . DENTA 5000 PLUS 1.1 % CREA dental cream USE TO BRUSH TEETH 1-2 TIMES PER DAY. NEITHER BRAND NOR GENERIC COVERED  1  . L-ARGININE-500 PO Take by mouth.    Marland Kitchen. OVER THE COUNTER MEDICATION Metagenics Men's vitality vitamin  Taking 1 packet by mouth once daily    . OVER THE COUNTER MEDICATION [employer]ics Testralin  Taking 1 tablet by mouth once daily    . OVER THE COUNTER MEDICATION Met[employer]ics Methyl Care  Taking 1 capsule by mouth two times a day    . OVER THE COUNTER MEDICATION [employer]ics Sinuplex  Taking 1 tablet by mouth once daily    . OVER THE COUNTER MEDICATION [employer] Chapter Zyflamend  Taking 1 capsule by mouth two times a day    . Polyethylene Glycol 3350 (MIRALAX PO) Take by mouth.    . tadalafil (ADCIRCA/CIALIS) 20 MG tablet 1 tab 30-60 minutes prior to intercourse 30 tablet 2   No current facility-administered medications for this visit.     No Known Allergies  Family History  Problem Relation Age of Onset  . Arthritis Mother   . Heart disease Mother   . Heart disease Father   . Hyperlipidemia Father   . Hypertension Father   . Cancer Maternal Grandfather        brain  . Stroke Paternal Grandmother   . Cancer Paternal Grandfather        lung    Social History   Socioeconomic History  . Marital status: Married    Spouse name: Not on file  . Number of children: Not on file  . Years of education: Not on file  . Highest education level: Not on file  Occupational History  . Not on file  Social Needs  . Financial resource strain: Not on file  . Food insecurity    Worry: Not on file    Inability: Not on  file  . Transportation needs    Medical: Not on file    Non-medical: Not on file  Tobacco Use  . Smoking status: Former Smoker    Quit date: 01/08/1987    Years since quitting: 31.5  . Smokeless tobacco: Never Used  Substance and Sexual Activity  . Alcohol use: Yes    Comment: occasionally  . Drug use: Not Currently  . Sexual activity: Not on file  Lifestyle  . Physical activity    Days per week: Not on file    Minutes per session: Not on file  . Stress: Not on file  Relationships  . Social Musicianconnections    Talks on phone: Not on file    Gets together: Not on file    Attends religious service: Not on file    Active member of club or organization: Not on file    Attends meetings of clubs or organizations: Not on file    Relationship status: Not on file  . Intimate partner violence    Fear of current or ex partner: Not on file    Emotionally abused: Not on file    Physically abused:  Not on file    Forced sexual activity: Not on file  Other Topics Concern  . Not on file  Social History Narrative   Married.   1 biologic child. 4 grandchildren.    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   Enjoys projects around the home, computers.     Hospitiliaztions: None  Health Maintenance:    Flu: Due this season  Pneumovax: Due today  Prevnar: Completed in 2019  Zostavax: Too expensive  Colonoscopy: Completed in 2016, due in 2026  Eye Doctor: Completed in 2019  Dental Exam: Completes semi-annually  PSA: 0.39 in 2019  Hep C Screening: Negative in 2019    Providers: Vernona RiegerKatherine Clark, PCP; Dentist, Eye Doctor; Dr. Lonna CobbStoioff, Urology   I have personally reviewed and have noted: 1. The patient's medical and social history 2. Their use of alcohol, tobacco or illicit drugs 3. Their current medications and supplements 4. The patient's functional ability including ADL's, fall risks, home  safety risks and hearing or visual impairment. 5. Diet and physical activities 6. Evidence for  depression or mood disorder  Subjective:   Review of Systems:   Constitutional: Denies fever, malaise, fatigue, headache or abrupt weight changes.  HEENT: Denies eye pain, eye redness, ear pain, ringing in the ears, wax buildup, runny nose, nasal congestion, bloody nose, or sore throat. Respiratory: Denies difficulty breathing, shortness of breath, cough or sputum production.   Cardiovascular: Denies chest pain, chest tightness, palpitations or swelling in the hands or feet.  Gastrointestinal: Denies abdominal pain, bloating, constipation, diarrhea or blood in the stool. Hemorrhoids, using cream OTC. GU: Denies urgency, frequency, pain with urination, burning sensation, blood in urine, odor or discharge. Musculoskeletal: Denies decrease in range of motion, difficulty with gait, muscle pain or joint pain and swelling.  Skin: Denies redness, rashes, lesions or ulcercations.  Neurological: Denies difficulty with memory, difficulty with speech or problems with balance and coordination. Some vertigo in May, following with Audiology Psychiatric: Denies concerns for anxiety or depression.   No other specific complaints in a complete review of systems (except as listed in HPI above).  Objective:  PE:   BP 140/70   Pulse 63   Temp 98.2 F (36.8 C) (Temporal)   Ht 5' 11.75" (1.822 m)   Wt 202 lb 12 oz (92 kg)   SpO2 99%   BMI 27.69 kg/m  Wt Readings from Last 3 Encounters:  08/05/18 202 lb 12 oz (92 kg)  03/04/18 205 lb (93 kg)  07/17/17 215 lb (97.5 kg)    General: Appears their stated age, well developed, well nourished in NAD. Skin: Warm, dry and intact. No rashes, lesions or ulcerations noted. HEENT: Head: normal shape and size; Eyes: sclera white, no icterus, conjunctiva pink, PERRLA and EOMs intact; Ears: Tm's gray and intact, normal light reflex; Nose: mucosa pink and moist, septum midline; Throat/Mouth: Teeth present, mucosa pink and moist, no exudate, lesions or ulcerations  noted.  Neck: Normal range of motion. Neck supple, trachea midline. No massses, lumps or thyromegaly present.  Cardiovascular: Normal rate and rhythm. S1,S2 noted.  No murmur, rubs or gallops noted. No JVD or BLE edema. No carotid bruits noted. Pulmonary/Chest: Normal effort and positive vesicular breath sounds. No respiratory distress. No wheezes, rales or ronchi noted.  Abdomen: Soft and nontender. Normal bowel sounds, no bruits noted. No distention or masses noted. Liver, spleen and kidneys non palpable. Musculoskeletal: Normal range of motion. No signs of joint swelling. No difficulty with gait.  Neurological: Alert  and oriented. Cranial nerves II-XII intact. Coordination normal. +DTRs bilaterally. Psychiatric: Mood and affect normal. Behavior is normal. Judgment and thought content normal.    BMET    Component Value Date/Time   NA 141 08/03/2018 0903   K 3.7 08/03/2018 0903   CL 105 08/03/2018 0903   CO2 28 08/03/2018 0903   GLUCOSE 101 (H) 08/03/2018 0903   BUN 17 08/03/2018 0903   CREATININE 0.93 08/03/2018 0903   CALCIUM 9.3 08/03/2018 0903    Lipid Panel     Component Value Date/Time   CHOL 175 08/03/2018 0903   TRIG 101.0 08/03/2018 0903   HDL 48.60 08/03/2018 0903   CHOLHDL 4 08/03/2018 0903   VLDL 20.2 08/03/2018 0903   LDLCALC 106 (H) 08/03/2018 0903    CBC No results found for: WBC, RBC, HGB, HCT, PLT, MCV, MCH, MCHC, RDW, LYMPHSABS, MONOABS, EOSABS, BASOSABS  Hgb A1C No results found for: HGBA1C    Assessment and Plan:   Medicare Annual Wellness Visit:  Diet: He endorses a healthy diet. He eats lean protein, vegetables, starch, mostly homemade meals. Drinking water, beer daily. No soda and sweet tea. Physical activity: He is active around his home, no regular exercise Depression/mood screen: Negative Hearing: Intact to whispered voice Visual acuity: Grossly normal, performs annual eye exam  ADLs: Capable Fall risk: None Home safety: Good Cognitive  evaluation: Intact to orientation, naming, recall and repetition EOL planning: Adv directives not completed, declines today. Full code.  Preventative Medicine: Due for pneumovax, provided today. Declines Shingrix due to cost. Declines tetanus due to cost. PSA UTD, following with Urology. Colonoscopy UTD, due in 2026. Exam unremarkable. Labs reviewed. Follow up in 1 year.  All recommendations provided at end of visit.  Next appointment: One year

## 2018-08-05 NOTE — Addendum Note (Signed)
Addended by: Jacqualin Combes on: 08/05/2018 04:04 PM   Modules accepted: Orders

## 2018-08-05 NOTE — Assessment & Plan Note (Signed)
Following with Urology, doing well on Cialis 10 mg.  Continue same.

## 2018-08-05 NOTE — Patient Instructions (Signed)
Stop by the lab prior to leaving today. I will notify you of your results once received.   Start exercising. You should be getting 150 minutes of moderate intensity exercise weekly.  Continue to work on a healthy diet. Make sure to eat plenty of vegetables, fruit, whole grains, lean protein.  Ensure you are consuming 64 ounces of water daily.  You were provided with your second and final pneumonia vaccination today.  Please consider complete the advance directives packet.  It was a pleasure to see you today!

## 2018-08-20 DIAGNOSIS — M9905 Segmental and somatic dysfunction of pelvic region: Secondary | ICD-10-CM | POA: Diagnosis not present

## 2018-08-20 DIAGNOSIS — M9903 Segmental and somatic dysfunction of lumbar region: Secondary | ICD-10-CM | POA: Diagnosis not present

## 2018-08-20 DIAGNOSIS — M955 Acquired deformity of pelvis: Secondary | ICD-10-CM | POA: Diagnosis not present

## 2018-08-20 DIAGNOSIS — M5416 Radiculopathy, lumbar region: Secondary | ICD-10-CM | POA: Diagnosis not present

## 2018-09-17 DIAGNOSIS — M9903 Segmental and somatic dysfunction of lumbar region: Secondary | ICD-10-CM | POA: Diagnosis not present

## 2018-09-17 DIAGNOSIS — M955 Acquired deformity of pelvis: Secondary | ICD-10-CM | POA: Diagnosis not present

## 2018-09-17 DIAGNOSIS — M5416 Radiculopathy, lumbar region: Secondary | ICD-10-CM | POA: Diagnosis not present

## 2018-09-17 DIAGNOSIS — M9905 Segmental and somatic dysfunction of pelvic region: Secondary | ICD-10-CM | POA: Diagnosis not present

## 2018-10-15 DIAGNOSIS — M5416 Radiculopathy, lumbar region: Secondary | ICD-10-CM | POA: Diagnosis not present

## 2018-10-15 DIAGNOSIS — M9905 Segmental and somatic dysfunction of pelvic region: Secondary | ICD-10-CM | POA: Diagnosis not present

## 2018-10-15 DIAGNOSIS — M955 Acquired deformity of pelvis: Secondary | ICD-10-CM | POA: Diagnosis not present

## 2018-10-15 DIAGNOSIS — M9903 Segmental and somatic dysfunction of lumbar region: Secondary | ICD-10-CM | POA: Diagnosis not present

## 2018-11-10 ENCOUNTER — Other Ambulatory Visit: Payer: Self-pay | Admitting: Physician Assistant

## 2018-11-10 DIAGNOSIS — I10 Essential (primary) hypertension: Secondary | ICD-10-CM | POA: Diagnosis not present

## 2018-11-10 DIAGNOSIS — H6980 Other specified disorders of Eustachian tube, unspecified ear: Secondary | ICD-10-CM | POA: Diagnosis not present

## 2018-11-10 DIAGNOSIS — H903 Sensorineural hearing loss, bilateral: Secondary | ICD-10-CM | POA: Diagnosis not present

## 2018-11-10 DIAGNOSIS — H9190 Unspecified hearing loss, unspecified ear: Secondary | ICD-10-CM

## 2018-11-10 DIAGNOSIS — H6121 Impacted cerumen, right ear: Secondary | ICD-10-CM | POA: Diagnosis not present

## 2018-11-12 DIAGNOSIS — M955 Acquired deformity of pelvis: Secondary | ICD-10-CM | POA: Diagnosis not present

## 2018-11-12 DIAGNOSIS — M9905 Segmental and somatic dysfunction of pelvic region: Secondary | ICD-10-CM | POA: Diagnosis not present

## 2018-11-12 DIAGNOSIS — M5416 Radiculopathy, lumbar region: Secondary | ICD-10-CM | POA: Diagnosis not present

## 2018-11-12 DIAGNOSIS — M9903 Segmental and somatic dysfunction of lumbar region: Secondary | ICD-10-CM | POA: Diagnosis not present

## 2018-11-19 ENCOUNTER — Ambulatory Visit
Admission: RE | Admit: 2018-11-19 | Discharge: 2018-11-19 | Disposition: A | Discharge status: Home / Self Care | Payer: Medicare Other | Source: Ambulatory Visit | Attending: Physician Assistant | Admitting: Physician Assistant

## 2018-11-19 ENCOUNTER — Other Ambulatory Visit: Payer: Self-pay

## 2018-11-19 DIAGNOSIS — H9190 Unspecified hearing loss, unspecified ear: Secondary | ICD-10-CM | POA: Insufficient documentation

## 2018-11-19 DIAGNOSIS — H9203 Otalgia, bilateral: Secondary | ICD-10-CM | POA: Diagnosis not present

## 2018-11-19 DIAGNOSIS — R519 Headache, unspecified: Secondary | ICD-10-CM | POA: Diagnosis not present

## 2018-11-19 LAB — POCT I-STAT CREATININE: Creatinine, Ser: 1 mg/dL (ref 0.61–1.24)

## 2018-11-19 MED ORDER — GADOBUTROL 1 MMOL/ML IV SOLN
9.0000 mL | Freq: Once | INTRAVENOUS | Status: AC | PRN
  Administered 2018-11-19: 9 mL via INTRAVENOUS

## 2018-11-20 ENCOUNTER — Ambulatory Visit: Payer: Medicare Other

## 2018-11-24 DIAGNOSIS — J322 Chronic ethmoidal sinusitis: Secondary | ICD-10-CM | POA: Diagnosis not present

## 2018-11-24 DIAGNOSIS — I619 Nontraumatic intracerebral hemorrhage, unspecified: Secondary | ICD-10-CM | POA: Diagnosis not present

## 2018-11-24 DIAGNOSIS — M26623 Arthralgia of bilateral temporomandibular joint: Secondary | ICD-10-CM | POA: Diagnosis not present

## 2018-11-24 DIAGNOSIS — H6983 Other specified disorders of Eustachian tube, bilateral: Secondary | ICD-10-CM | POA: Diagnosis not present

## 2018-11-24 DIAGNOSIS — H90A22 Sensorineural hearing loss, unilateral, left ear, with restricted hearing on the contralateral side: Secondary | ICD-10-CM | POA: Diagnosis not present

## 2018-11-30 NOTE — Telephone Encounter (Signed)
Spoke with patient via phone regarding MRI results.   I called the radiologist, Dr. 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 Notified patient of radiologists remarks, patient had no further questions.

## 2018-12-02 ENCOUNTER — Other Ambulatory Visit: Payer: Self-pay

## 2018-12-08 ENCOUNTER — Other Ambulatory Visit: Payer: Self-pay | Admitting: Physician Assistant

## 2018-12-08 DIAGNOSIS — M26623 Arthralgia of bilateral temporomandibular joint: Secondary | ICD-10-CM | POA: Diagnosis not present

## 2018-12-08 DIAGNOSIS — H6983 Other specified disorders of Eustachian tube, bilateral: Secondary | ICD-10-CM | POA: Diagnosis not present

## 2018-12-10 DIAGNOSIS — M5416 Radiculopathy, lumbar region: Secondary | ICD-10-CM | POA: Diagnosis not present

## 2018-12-10 DIAGNOSIS — M955 Acquired deformity of pelvis: Secondary | ICD-10-CM | POA: Diagnosis not present

## 2018-12-10 DIAGNOSIS — M9903 Segmental and somatic dysfunction of lumbar region: Secondary | ICD-10-CM | POA: Diagnosis not present

## 2018-12-10 DIAGNOSIS — H6983 Other specified disorders of Eustachian tube, bilateral: Secondary | ICD-10-CM | POA: Diagnosis not present

## 2018-12-10 DIAGNOSIS — M9905 Segmental and somatic dysfunction of pelvic region: Secondary | ICD-10-CM | POA: Diagnosis not present

## 2018-12-22 ENCOUNTER — Other Ambulatory Visit: Payer: Self-pay

## 2018-12-22 ENCOUNTER — Ambulatory Visit
Admission: RE | Admit: 2018-12-22 | Discharge: 2018-12-22 | Disposition: A | Discharge status: Home / Self Care | Payer: Medicare Other | Source: Ambulatory Visit | Attending: Physician Assistant | Admitting: Physician Assistant

## 2018-12-22 DIAGNOSIS — H6983 Other specified disorders of Eustachian tube, bilateral: Secondary | ICD-10-CM

## 2019-01-21 DIAGNOSIS — H9121 Sudden idiopathic hearing loss, right ear: Secondary | ICD-10-CM | POA: Diagnosis not present

## 2019-01-28 DIAGNOSIS — H9121 Sudden idiopathic hearing loss, right ear: Secondary | ICD-10-CM | POA: Diagnosis not present

## 2019-02-04 DIAGNOSIS — M5416 Radiculopathy, lumbar region: Secondary | ICD-10-CM | POA: Diagnosis not present

## 2019-02-04 DIAGNOSIS — H903 Sensorineural hearing loss, bilateral: Secondary | ICD-10-CM | POA: Diagnosis not present

## 2019-02-04 DIAGNOSIS — M9903 Segmental and somatic dysfunction of lumbar region: Secondary | ICD-10-CM | POA: Diagnosis not present

## 2019-02-04 DIAGNOSIS — M955 Acquired deformity of pelvis: Secondary | ICD-10-CM | POA: Diagnosis not present

## 2019-02-04 DIAGNOSIS — M9905 Segmental and somatic dysfunction of pelvic region: Secondary | ICD-10-CM | POA: Diagnosis not present

## 2019-03-04 DIAGNOSIS — M9903 Segmental and somatic dysfunction of lumbar region: Secondary | ICD-10-CM | POA: Diagnosis not present

## 2019-03-04 DIAGNOSIS — M955 Acquired deformity of pelvis: Secondary | ICD-10-CM | POA: Diagnosis not present

## 2019-03-04 DIAGNOSIS — M9905 Segmental and somatic dysfunction of pelvic region: Secondary | ICD-10-CM | POA: Diagnosis not present

## 2019-03-04 DIAGNOSIS — M5416 Radiculopathy, lumbar region: Secondary | ICD-10-CM | POA: Diagnosis not present

## 2019-03-30 DIAGNOSIS — Z23 Encounter for immunization: Secondary | ICD-10-CM | POA: Diagnosis not present

## 2019-03-30 DIAGNOSIS — M955 Acquired deformity of pelvis: Secondary | ICD-10-CM | POA: Diagnosis not present

## 2019-03-30 DIAGNOSIS — M9903 Segmental and somatic dysfunction of lumbar region: Secondary | ICD-10-CM | POA: Diagnosis not present

## 2019-03-30 DIAGNOSIS — M5416 Radiculopathy, lumbar region: Secondary | ICD-10-CM | POA: Diagnosis not present

## 2019-03-30 DIAGNOSIS — M9905 Segmental and somatic dysfunction of pelvic region: Secondary | ICD-10-CM | POA: Diagnosis not present

## 2019-04-27 DIAGNOSIS — M9905 Segmental and somatic dysfunction of pelvic region: Secondary | ICD-10-CM | POA: Diagnosis not present

## 2019-04-27 DIAGNOSIS — M955 Acquired deformity of pelvis: Secondary | ICD-10-CM | POA: Diagnosis not present

## 2019-04-27 DIAGNOSIS — M5416 Radiculopathy, lumbar region: Secondary | ICD-10-CM | POA: Diagnosis not present

## 2019-04-27 DIAGNOSIS — M9903 Segmental and somatic dysfunction of lumbar region: Secondary | ICD-10-CM | POA: Diagnosis not present

## 2019-04-27 DIAGNOSIS — Z23 Encounter for immunization: Secondary | ICD-10-CM | POA: Diagnosis not present

## 2019-05-20 DIAGNOSIS — H8102 Meniere's disease, left ear: Secondary | ICD-10-CM | POA: Diagnosis not present

## 2019-05-20 DIAGNOSIS — R42 Dizziness and giddiness: Secondary | ICD-10-CM | POA: Diagnosis not present

## 2019-05-20 DIAGNOSIS — H903 Sensorineural hearing loss, bilateral: Secondary | ICD-10-CM | POA: Diagnosis not present

## 2019-05-24 ENCOUNTER — Telehealth: Payer: Self-pay | Admitting: *Deleted

## 2019-05-24 NOTE — Telephone Encounter (Addendum)
Message left for Sandy to return my call.  

## 2019-05-24 NOTE — Telephone Encounter (Signed)
Sandy with Dr. Gregary Cromer office left a voicemail stating that they had faxed over a form Thursday for medication clearance. Larry Bentley stated that they need to know if it is okay for patient to be put on HCTZ for vertigo/hearing loss. Sandy requested a call back or information put on the form and faxed back to them

## 2019-05-24 NOTE — Telephone Encounter (Signed)
I don't see any contraindication for him starting HCTZ for vertigo. He will need follow up BMP and BP check 2-3 weeks after starting HCTZ. We are happy to see him for this, or he can follow back up with ENT.

## 2019-05-25 DIAGNOSIS — M955 Acquired deformity of pelvis: Secondary | ICD-10-CM | POA: Diagnosis not present

## 2019-05-25 DIAGNOSIS — M9905 Segmental and somatic dysfunction of pelvic region: Secondary | ICD-10-CM | POA: Diagnosis not present

## 2019-05-25 DIAGNOSIS — M9903 Segmental and somatic dysfunction of lumbar region: Secondary | ICD-10-CM | POA: Diagnosis not present

## 2019-05-25 DIAGNOSIS — M5416 Radiculopathy, lumbar region: Secondary | ICD-10-CM | POA: Diagnosis not present

## 2019-05-25 NOTE — Telephone Encounter (Signed)
Sandy returned phone call. I notified her of Kate's recommendations and comments and she verbalized understanding. She states there office typically does not do BP checks, so he would need to follow up here. Andrey Campanile states she will let the patient know to schedule an appt to follow up with [place] 2-3 weeks after starting HCTZ.

## 2019-06-08 ENCOUNTER — Other Ambulatory Visit: Payer: Self-pay

## 2019-06-08 ENCOUNTER — Encounter: Payer: Self-pay | Admitting: Primary Care

## 2019-06-08 ENCOUNTER — Ambulatory Visit (INDEPENDENT_AMBULATORY_CARE_PROVIDER_SITE_OTHER): Payer: Medicare Other | Admitting: Primary Care

## 2019-06-08 VITALS — BP 140/70 | HR 61 | Temp 96.9°F | Ht 71.75 in | Wt 212.8 lb

## 2019-06-08 DIAGNOSIS — H8109 Meniere's disease, unspecified ear: Secondary | ICD-10-CM | POA: Diagnosis not present

## 2019-06-08 LAB — BASIC METABOLIC PANEL
BUN: 24 mg/dL — ABNORMAL HIGH (ref 6–23)
CO2: 31 mEq/L (ref 19–32)
Calcium: 9.7 mg/dL (ref 8.4–10.5)
Chloride: 101 mEq/L (ref 96–112)
Creatinine, Ser: 0.96 mg/dL (ref 0.40–1.50)
GFR: 77.47 mL/min (ref >60.00)
Glucose, Bld: 99 mg/dL (ref 70–99)
Potassium: 4.3 mEq/L (ref 3.5–5.1)
Sodium: 137 mEq/L (ref 135–145)

## 2019-06-08 NOTE — Patient Instructions (Signed)
Stop by the lab prior to leaving today. I will notify you of your results once received.   Please schedule your wellness visit with our nurse and a visit with me for late July/early August 2021.  It was a pleasure to see you today!

## 2019-06-08 NOTE — Assessment & Plan Note (Signed)
Followed by ENT who is treating with HCTZ 12.5 mg. BP today borderline high, will need to monitor. Given initiation of HCTZ, we will need to check BMP which is pending.

## 2019-06-08 NOTE — Progress Notes (Signed)
Subjective:    Patient ID: Larry Bentley, male    DOB: July 15, 1950, 69 y.o.   MRN: [id number]  HPI  This visit occurred during the SARS-CoV-2 public health emergency.  Safety protocols were in place, including screening questions prior to the visit, additional usage of staff PPE, and extensive cleaning of exam room while observing appropriate contact time as indicated for disinfecting solutions.   Larry Bentley is a 69 year old male with a history of hyperglycemia, erectile dysfunction who presents today for follow up after initiation of HCTZ.  He was recently evaluated by ENT for symptoms of dizziness and hearing loss. He was diagnosed with Meniere's disease and it was recommended he start HCTZ. He is currently managed on hydrochlorothiazide 12.5 mg for which he's taken for the last two weeks.   He does not check his BP at home. He has noticed some decrease in dizziness. Denies chest pain, shortness of breath.   BP Readings from Last 3 Encounters:  06/08/19 140/70  08/05/18 140/70  03/04/18 (!) 167/76     Review of Systems  Eyes: Negative for visual disturbance.  Respiratory: Negative for shortness of breath.   Cardiovascular: Negative for chest pain.  Neurological: Positive for dizziness. Negative for headaches.       Past Medical History:  Diagnosis Date  . Hypertension   . Pneumonia   . Porphyria cutanea tarda ([place])   . SBO (small bowel obstruction) (HCC)      Social History   Socioeconomic History  . Marital status: Married    Spouse name: Not on file  . Number of children: Not on file  . Years of education: Not on file  . Highest education level: Not on file  Occupational History  . Not on file  Tobacco Use  . Smoking status: Former Smoker    Quit date: 01/08/1987    Years since quitting: 32.4  . Smokeless tobacco: Never Used  Substance and Sexual Activity  . Alcohol use: Yes    Comment: occasionally  . Drug use: Not Currently  . Sexual activity: Not on file    Other Topics Concern  . Not on file  Social History Narrative   Married.   1 biologic child. 4 grandchildren.    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   Enjoys projects around the home, computers.    Social Determinants of Health   Financial Resource Strain:   . Difficulty of Paying Living Expenses:   Food Insecurity:   . Worried About Charity fundraiser in the Last Year:   . Arboriculturist in the Last Year:   Transportation Needs:   . Film/video editor (Medical):   Marland Kitchen Lack of Transportation (Non-Medical):   Physical Activity:   . Days of Exercise per Week:   . Minutes of Exercise per Session:   Stress:   . Feeling of Stress :   Social Connections:   . Frequency of Communication with Friends and Family:   . Frequency of Social Gatherings with Friends and Family:   . Attends Religious Services:   . Active Member of Clubs or Organizations:   . Attends Archivist Meetings:   Marland Kitchen Marital Status:   Intimate Partner Violence:   . Fear of Current or Ex-Partner:   . Emotionally Abused:   Marland Kitchen Physically Abused:   . Sexually Abused:     Past Surgical History:  Procedure Laterality Date  . HEMORROIDECTOMY  08/11/2014    Family  History  Problem Relation Age of Onset  . Arthritis Mother   . Heart disease Mother   . Heart disease Father   . Hyperlipidemia Father   . Hypertension Father   . Cancer Maternal Grandfather        brain  . Stroke Paternal Grandmother   . Cancer Paternal Grandfather        lung    No Known Allergies  Current Outpatient Medications on File Prior to Visit  Medication Sig Dispense Refill  . DENTA 5000 PLUS 1.1 % CREA dental cream USE TO BRUSH TEETH 1-2 TIMES PER DAY. NEITHER BRAND NOR GENERIC COVERED  1  . hydrochlorothiazide (HYDRODIURIL) 12.5 MG tablet Take 12.5 mg by mouth daily.    . L-ARGININE-500 PO Take by mouth.    Marland Kitchen OVER THE COUNTER MEDICATION Metagenics Men's vitality vitamin  Taking 1 packet by mouth once daily    .  OVER THE COUNTER MEDICATION [employer]ics Sinuplex  Taking 1 tablet by mouth once daily    . Polyethylene Glycol 3350 (MIRALAX PO) Take by mouth.    . tadalafil (ADCIRCA/CIALIS) 20 MG tablet 1 tab 30-60 minutes prior to intercourse 30 tablet 2   No current facility-administered medications on file prior to visit.    BP 140/70   Pulse 61   Temp (!) 96.9 F (36.1 C) (Temporal)   Ht 5' 11.75" (1.822 m)   Wt 212 lb 12 oz (96.5 kg)   SpO2 98%   BMI 29.06 kg/m    Objective:   Physical Exam  Constitutional: He appears well-nourished.  Cardiovascular: Normal rate and regular rhythm.  Respiratory: Effort normal and breath sounds normal.  Musculoskeletal:     Cervical back: Neck supple.  Skin: Skin is warm and dry.           Assessment & Plan:

## 2019-06-22 DIAGNOSIS — M9903 Segmental and somatic dysfunction of lumbar region: Secondary | ICD-10-CM | POA: Diagnosis not present

## 2019-06-22 DIAGNOSIS — M9905 Segmental and somatic dysfunction of pelvic region: Secondary | ICD-10-CM | POA: Diagnosis not present

## 2019-06-22 DIAGNOSIS — M955 Acquired deformity of pelvis: Secondary | ICD-10-CM | POA: Diagnosis not present

## 2019-06-22 DIAGNOSIS — M5416 Radiculopathy, lumbar region: Secondary | ICD-10-CM | POA: Diagnosis not present

## 2019-07-20 DIAGNOSIS — M955 Acquired deformity of pelvis: Secondary | ICD-10-CM | POA: Diagnosis not present

## 2019-07-20 DIAGNOSIS — M5416 Radiculopathy, lumbar region: Secondary | ICD-10-CM | POA: Diagnosis not present

## 2019-07-20 DIAGNOSIS — M9905 Segmental and somatic dysfunction of pelvic region: Secondary | ICD-10-CM | POA: Diagnosis not present

## 2019-07-20 DIAGNOSIS — M9903 Segmental and somatic dysfunction of lumbar region: Secondary | ICD-10-CM | POA: Diagnosis not present

## 2019-07-23 ENCOUNTER — Other Ambulatory Visit: Payer: Self-pay | Admitting: Primary Care

## 2019-07-23 DIAGNOSIS — Z125 Encounter for screening for malignant neoplasm of prostate: Secondary | ICD-10-CM

## 2019-07-23 DIAGNOSIS — E785 Hyperlipidemia, unspecified: Secondary | ICD-10-CM

## 2019-08-06 ENCOUNTER — Other Ambulatory Visit (INDEPENDENT_AMBULATORY_CARE_PROVIDER_SITE_OTHER): Payer: Medicare Other

## 2019-08-06 ENCOUNTER — Other Ambulatory Visit: Payer: Self-pay

## 2019-08-06 ENCOUNTER — Ambulatory Visit (INDEPENDENT_AMBULATORY_CARE_PROVIDER_SITE_OTHER): Payer: Medicare Other

## 2019-08-06 DIAGNOSIS — E785 Hyperlipidemia, unspecified: Secondary | ICD-10-CM | POA: Diagnosis not present

## 2019-08-06 DIAGNOSIS — Z125 Encounter for screening for malignant neoplasm of prostate: Secondary | ICD-10-CM | POA: Diagnosis not present

## 2019-08-06 DIAGNOSIS — Z0000 Encounter for general adult medical examination without abnormal findings: Secondary | ICD-10-CM

## 2019-08-06 LAB — COMPREHENSIVE METABOLIC PANEL
ALT: 15 U/L (ref 0–53)
AST: 19 U/L (ref 0–37)
Albumin: 4.5 g/dL (ref 3.5–5.2)
Alkaline Phosphatase: 35 U/L — ABNORMAL LOW (ref 39–117)
BUN: 18 mg/dL (ref 6–23)
CO2: 31 mEq/L (ref 19–32)
Calcium: 9.5 mg/dL (ref 8.4–10.5)
Chloride: 102 mEq/L (ref 96–112)
Creatinine, Ser: 0.95 mg/dL (ref 0.40–1.50)
GFR: 78.38 mL/min (ref >60.00)
Glucose, Bld: 101 mg/dL — ABNORMAL HIGH (ref 70–99)
Potassium: 4.2 mEq/L (ref 3.5–5.1)
Sodium: 139 mEq/L (ref 135–145)
Total Bilirubin: 1.1 mg/dL (ref 0.2–1.2)
Total Protein: 6.7 g/dL (ref 6.0–8.3)

## 2019-08-06 LAB — LIPID PANEL
Cholesterol: 196 mg/dL (ref 0–200)
HDL: 56.5 mg/dL (ref >39.00)
LDL Cholesterol: 117 mg/dL — ABNORMAL HIGH (ref 0–99)
NonHDL: 139.4
Total CHOL/HDL Ratio: 3
Triglycerides: 114 mg/dL (ref 0.0–149.0)
VLDL: 22.8 mg/dL (ref 0.0–40.0)

## 2019-08-06 LAB — CBC
HCT: 43.1 % (ref 39.0–52.0)
Hemoglobin: 15.1 g/dL (ref 13.0–17.0)
MCHC: 34.9 g/dL (ref 30.0–36.0)
MCV: 89.5 fl (ref 78.0–100.0)
Platelets: 152 10*3/uL (ref 150.0–400.0)
RBC: 4.82 Mil/uL (ref 4.22–5.81)
RDW: 12.8 % (ref 11.5–15.5)
WBC: 4.6 10*3/uL (ref 4.0–10.5)

## 2019-08-06 LAB — PSA, MEDICARE: PSA: 0.4 ng/ml (ref 0.10–4.00)

## 2019-08-06 NOTE — Patient Instructions (Signed)
Larry Bentley , Thank you for taking time to come for your Medicare Wellness Visit. I appreciate your ongoing commitment to your health goals. Please review the following plan we discussed and let me know if I can assist you in the future.   Screening recommendations/referrals: Colonoscopy: Up to date, completed 05/08/2014, due 05/2024 Recommended yearly ophthalmology/optometry visit for glaucoma screening and checkup Recommended yearly dental visit for hygiene and checkup  Vaccinations: Influenza vaccine: due Fall 2021 Pneumococcal vaccine: Completed series Tdap vaccine: decline- insurance/financial Shingles vaccine: due, check with your insurance company regarding coverage   Covid-19: Completed series  Advanced directives: Advance directive discussed with you today. Even though you declined this today please call our office should you change your mind and we can give you the proper paperwork for you to fill out.   Conditions/risks identified: none  Next appointment: Follow up in one year for your annual wellness visit.   Preventive Care 70 Years and Older, Male Preventive care refers to lifestyle choices and visits with your health care provider that can promote health and wellness. What does preventive care include?  A yearly physical exam. This is also called an annual well check.  Dental exams once or twice a year.  Routine eye exams. Ask your health care provider how often you should have your eyes checked.  Personal lifestyle choices, including:  Daily care of your teeth and gums.  Regular physical activity.  Eating a healthy diet.  Avoiding tobacco and drug use.  Limiting alcohol use.  Practicing safe sex.  Taking low doses of aspirin every day.  Taking vitamin and mineral supplements as recommended by your health care provider. What happens during an annual well check? The services and screenings done by your health care provider during your annual well check will  depend on your age, overall health, lifestyle risk factors, and family history of disease. Counseling  Your health care provider may ask you questions about your:  Alcohol use.  Tobacco use.  Drug use.  Emotional well-being.  Home and relationship well-being.  Sexual activity.  Eating habits.  History of falls.  Memory and ability to understand (cognition).  Work and work Astronomer. Screening  You may have the following tests or measurements:  Height, weight, and BMI.  Blood pressure.  Lipid and cholesterol levels. These may be checked every 5 years, or more frequently if you are over 70 years old.  Skin check.  Lung cancer screening. You may have this screening every year starting at age 70 if you have a 30-pack-year history of smoking and currently smoke or have quit within the past 15 years.  Fecal occult blood test (FOBT) of the stool. You may have this test every year starting at age 70.  Flexible sigmoidoscopy or colonoscopy. You may have a sigmoidoscopy every 5 years or a colonoscopy every 10 years starting at age 70.  Prostate cancer screening. Recommendations will vary depending on your family history and other risks.  Hepatitis C blood test.  Hepatitis B blood test.  Sexually transmitted disease (STD) testing.  Diabetes screening. This is done by checking your blood sugar (glucose) after you have not eaten for a while (fasting). You may have this done every 1-3 years.  Abdominal aortic aneurysm (AAA) screening. You may need this if you are a current or former smoker.  Osteoporosis. You may be screened starting at age 70 if you are at high risk. Talk with your health care provider about your test results, treatment options,  and if necessary, the need for more tests. Vaccines  Your health care provider may recommend certain vaccines, such as:  Influenza vaccine. This is recommended every year.  Tetanus, diphtheria, and acellular pertussis (Tdap,  Td) vaccine. You may need a Td booster every 10 years.  Zoster vaccine. You may need this after age 70.  Pneumococcal 13-valent conjugate (PCV13) vaccine. One dose is recommended after age 70.  Pneumococcal polysaccharide (PPSV23) vaccine. One dose is recommended after age 70. Talk to your health care provider about which screenings and vaccines you need and how often you need them. This information is not intended to replace advice given to you by your health care provider. Make sure you discuss any questions you have with your health care provider. Document Released: 01/20/2015 Document Revised: 09/13/2015 Document Reviewed: 10/25/2014 Elsevier Interactive Patient Education  2017 [place] Prevention in the Home Falls can cause injuries. They can happen to people of all ages. There are many things you can do to make your home safe and to help prevent falls. What can I do on the outside of my home?  Regularly fix the edges of walkways and driveways and fix any cracks.  Remove anything that might make you trip as you walk through a door, such as a raised step or threshold.  Trim any bushes or trees on the path to your home.  Use bright outdoor lighting.  Clear any walking paths of anything that might make someone trip, such as rocks or tools.  Regularly check to see if handrails are loose or broken. Make sure that both sides of any steps have handrails.  Any raised decks and porches should have guardrails on the edges.  Have any leaves, snow, or ice cleared regularly.  Use sand or salt on walking paths during winter.  Clean up any spills in your garage right away. This includes oil or grease spills. What can I do in the bathroom?  Use night lights.  Install grab bars by the toilet and in the tub and shower. Do not use towel bars as grab bars.  Use non-skid mats or decals in the tub or shower.  If you need to sit down in the shower, use a plastic, non-slip  stool.  Keep the floor dry. Clean up any water that spills on the floor as soon as it happens.  Remove soap buildup in the tub or shower regularly.  Attach bath mats securely with double-sided non-slip rug tape.  Do not have throw rugs and other things on the floor that can make you trip. What can I do in the bedroom?  Use night lights.  Make sure that you have a light by your bed that is easy to reach.  Do not use any sheets or blankets that are too big for your bed. They should not hang down onto the floor.  Have a firm chair that has side arms. You can use this for support while you get dressed.  Do not have throw rugs and other things on the floor that can make you trip. What can I do in the kitchen?  Clean up any spills right away.  Avoid walking on wet floors.  Keep items that you use a lot in easy-to-reach places.  If you need to reach something above you, use a strong step stool that has a grab bar.  Keep electrical cords out of the way.  Do not use floor polish or wax that makes floors slippery. If  you must use wax, use non-skid floor wax.  Do not have throw rugs and other things on the floor that can make you trip. What can I do with my stairs?  Do not leave any items on the stairs.  Make sure that there are handrails on both sides of the stairs and use them. Fix handrails that are broken or loose. Make sure that handrails are as long as the stairways.  Check any carpeting to make sure that it is firmly attached to the stairs. Fix any carpet that is loose or worn.  Avoid having throw rugs at the top or bottom of the stairs. If you do have throw rugs, attach them to the floor with carpet tape.  Make sure that you have a light switch at the top of the stairs and the bottom of the stairs. If you do not have them, ask someone to add them for you. What else can I do to help prevent falls?  Wear shoes that:  Do not have high heels.  Have rubber bottoms.  Are  comfortable and fit you well.  Are closed at the toe. Do not wear sandals.  If you use a stepladder:  Make sure that it is fully opened. Do not climb a closed stepladder.  Make sure that both sides of the stepladder are locked into place.  Ask someone to hold it for you, if possible.  Clearly mark and make sure that you can see:  Any grab bars or handrails.  First and last steps.  Where the edge of each step is.  Use tools that help you move around (mobility aids) if they are needed. These include:  Canes.  Walkers.  Scooters.  Crutches.  Turn on the lights when you go into a dark area. Replace any light bulbs as soon as they burn out.  Set up your furniture so you have a clear path. Avoid moving your furniture around.  If any of your floors are uneven, fix them.  If there are any pets around you, be aware of where they are.  Review your medicines with your doctor. Some medicines can make you feel dizzy. This can increase your chance of falling. Ask your doctor what other things that you can do to help prevent falls. This information is not intended to replace advice given to you by your health care provider. Make sure you discuss any questions you have with your health care provider. Document Released: 10/20/2008 Document Revised: 06/01/2015 Document Reviewed: 01/28/2014 Elsevier Interactive Patient Education  2017 [employer].

## 2019-08-06 NOTE — Progress Notes (Signed)
Subjective:   Larry Bentley is a 69 y.o. male who presents for Medicare Annual/Subsequent preventive examination.  Review of Systems: N/A      I connected with the patient today by telephone and verified that I am speaking with the correct person using two identifiers. Location patient: home Location nurse: work Persons participating in the virtual visit: patient, Engineer, civil (consulting).   I discussed the limitations, risks, security and privacy concerns of performing an evaluation and management service by telephone and the availability of in person appointments. I also discussed with the patient that there may be a patient responsible charge related to this service. The patient expressed understanding and verbally consented to this telephonic visit.    Interactive audio and video telecommunications were attempted between this nurse and patient, however failed, due to patient having technical difficulties OR patient did not have access to video capability.  We continued and completed visit with audio only.     Cardiac Risk Factors include: advanced age (>46men, >73 women);male gender     Objective:    Today's Vitals   There is no height or weight on file to calculate BMI.  Advanced Directives 08/06/2019 07/17/2017  Does Patient Have a Medical Advance Directive? No No  Would patient like information on creating a medical advance directive? No - Patient declined No - Patient declined    Current Medications (verified) Outpatient Encounter Medications as of 08/06/2019  Medication Sig  . DENTA 5000 PLUS 1.1 % CREA dental cream USE TO BRUSH TEETH 1-2 TIMES PER DAY. NEITHER BRAND NOR GENERIC COVERED  . hydrochlorothiazide (HYDRODIURIL) 12.5 MG tablet Take 12.5 mg by mouth daily.  Marland Kitchen OVER THE COUNTER MEDICATION [employer] Men's vitality vitamin  Taking 1 packet by mouth once daily  . OVER THE COUNTER MEDICATION [employer] Sinuplex  Taking 1 tablet by mouth once daily  . Polyethylene Glycol 3350  (MIRALAX PO) Take by mouth.  . tadalafil (ADCIRCA/CIALIS) 20 MG tablet 1 tab 30-60 minutes prior to intercourse  . L-ARGININE-500 PO Take by mouth.   No facility-administered encounter medications on file as of 08/06/2019.    Allergies (verified) Patient has no known allergies.   History: Past Medical History:  Diagnosis Date  . Hypertension   . Pneumonia   . Porphyria cutanea tarda (HCC)   . SBO (small bowel obstruction) (HCC)    Past Surgical History:  Procedure Laterality Date  . HEMORROIDECTOMY  08/11/2014   Family History  Problem Relation Age of Onset  . Arthritis Mother   . Heart disease Mother   . Heart disease Father   . Hyperlipidemia Father   . Hypertension Father   . Cancer Maternal Grandfather        brain  . Stroke Paternal Grandmother   . Cancer Paternal Grandfather        lung   Social History   Socioeconomic History  . Marital status: Married    Spouse name: Not on file  . Number of children: Not on file  . Years of education: Not on file  . Highest education level: Not on file  Occupational History  . Not on file  Tobacco Use  . Smoking status: Former Smoker    Quit date: 01/08/1987    Years since quitting: 32.5  . Smokeless tobacco: Never Used  Vaping Use  . Vaping Use: Never used  Substance and Sexual Activity  . Alcohol use: Yes    Alcohol/week: 15.0 standard drinks    Types: 15 Cans of beer  per week  . Drug use: Not Currently  . Sexual activity: Not on file  Other Topics Concern  . Not on file  Social History Narrative   Married.   1 biologic child. 4 grandchildren.    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   Enjoys projects around the home, computers.    Social Determinants of Health   Financial Resource Strain: Low Risk   . Difficulty of Paying Living Expenses: Not hard at all  Food Insecurity: No Food Insecurity  . Worried About Programme researcher, broadcasting/film/videounning Out of Food in the Last Year: Never true  . Ran Out of Food in the Last Year: Never true    Transportation Needs: No Transportation Needs  . Lack of Transportation (Medical): No  . Lack of Transportation (Non-Medical): No  Physical Activity: Inactive  . Days of Exercise per Week: 0 days  . Minutes of Exercise per Session: 0 min  Stress: No Stress Concern Present  . Feeling of Stress : Not at all  Social Connections:   . Frequency of Communication with Friends and Family:   . Frequency of Social Gatherings with Friends and Family:   . Attends Religious Services:   . Active Member of Clubs or Organizations:   . Attends BankerClub or Organization Meetings:   Marland Kitchen. Marital Status:     Tobacco Counseling Counseling given: Not Answered   Clinical Intake:  Pre-visit preparation completed: Yes  Pain : No/denies pain     Nutritional Risks: None Diabetes: No  How often do you need to have someone help you when you read instructions, pamphlets, or other written materials from your doctor or pharmacy?: 1 - Never What is the last grade level you completed in school?: bachelors  Diabetic: No Nutrition Risk Assessment:  Has the patient had any N/V/D within the last 2 months?  No  Does the patient have any non-healing wounds?  No  Has the patient had any unintentional weight loss or weight gain?  No   Diabetes:  Is the patient diabetic?  No  If diabetic, was a CBG obtained today?  No  Did the patient bring in their glucometer from home?  No  How often do you monitor your CBG's? N/A.   Financial Strains and Diabetes Management:  Are you having any financial strains with the device, your supplies or your medication? N/A.  Does the patient want to be seen by Chronic Care Management for management of their diabetes?  N/A Would the patient like to be referred to a Nutritionist or for Diabetic Management?  N/A     Interpreter Needed?: No  Information entered by :: CJohnson, LPN   Activities of Daily Living In your present state of health, do you have any difficulty  performing the following activities: 08/06/2019  Hearing? Y  Comment wears hearing aids  Vision? N  Difficulty concentrating or making decisions? N  Walking or climbing stairs? N  Dressing or bathing? N  Doing errands, shopping? N  Preparing Food and eating ? N  Using the Toilet? N  In the past six months, have you accidently leaked urine? N  Do you have problems with loss of bowel control? N  Managing your Medications? N  Managing your Finances? N  Housekeeping or managing your Housekeeping? N  Some recent data might be hidden    Patient Care Team: Doreene Nestlark, Katherine K, NP as PCP - General (Internal Medicine) Blair PromiseBulakowski, Neill, OD as Referring Physician (Optometry)  Indicate any recent Medical Services you may  have received from other than [facility] providers in the past year (date may be approximate).     Assessment:   This is a routine wellness examination for Kaelan.  Hearing/Vision screen  Hearing Screening   125Hz  250Hz  500Hz  1000Hz  2000Hz  3000Hz  4000Hz  6000Hz  8000Hz   Right ear:           Left ear:           Vision Screening Comments: Patient gets annual eye exams  Dietary issues and exercise activities discussed: Current Exercise Habits: The patient does not participate in regular exercise at present, Exercise limited by: None identified  Goals    . 7/     08/07/2019, I will maintain and continue medications as prescribed.    DIET - INCREASE WATER INTAKE     Starting 07/17/2017, I will attempt to drink at least 6-8 glasses of water daily.       Depression Screen PHQ 2/9 Scores 08/06/2019 08/05/2018 07/17/2017  PHQ - 2 Score 0 0 0  PHQ- 9 Score 0 - 0    Fall Risk Fall Risk  08/06/2019 12/02/2018 08/05/2018 07/17/2017  Falls in the past year? 0 0 0 No  Comment - Emmi Telephone Survey: data to providers prior to load - -  Number falls in past yr: 0 - 0 -  Injury with Fall? 0 - 0 -  Risk for fall due to : Medication side effect - - -  Follow up Falls evaluation  completed;Falls prevention discussed - - -    Any stairs in or around the home? Yes  If so, are there any without handrails? No  Home free of loose throw rugs in walkways, pet beds, electrical cords, etc? Yes  Adequate lighting in your home to reduce risk of falls? Yes   ASSISTIVE DEVICES UTILIZED TO PREVENT FALLS:  Life alert? No  Use of a cane, walker or w/c? No  Grab bars in the bathroom? No  Shower chair or bench in shower? No  Elevated toilet seat or a handicapped toilet? No   TIMED UP AND GO:  Was the test performed? N/A, telephonic visit .    Cognitive Function: MMSE - Mini Mental State Exam 08/06/2019 07/17/2017  Orientation to time 5 5  Orientation to Place 5 5  Registration 3 3  Attention/ Calculation 5 0  Recall 3 3  Language- name 2 objects - 0  Language- repeat 1 1  Language- follow 3 step command - 3  Language- read & follow direction - 0  Write a sentence - 0  Copy design - 0  Total score - 20  Mini Cog  Mini-Cog screen was completed. Maximum score is 22. A value of 0 denotes this part of the MMSE was not completed or the patient failed this part of the Mini-Cog screening.       Immunizations Immunization History  Administered Date(s) Administered  . PFIZER SARS-COV-2 Vaccination 03/30/2019, 04/27/2019  . Pneumococcal Conjugate-13 07/17/2017  . Pneumococcal Polysaccharide-23 08/05/2018    TDAP status: Due, Education has been provided regarding the importance of this vaccine. Advised may receive this vaccine at local pharmacy or Health Dept. Aware to provide a copy of the vaccination record if obtained from local pharmacy or Health Dept. Verbalized acceptance and understanding. Flu Vaccine status: due Fall 2021 Pneumococcal vaccine status: Up to date Covid-19 vaccine status: Completed vaccines  Qualifies for Shingles Vaccine? Yes   Zostavax completed No   Shingrix Completed?: No.    Education has  been provided regarding the importance of this  vaccine. Patient has been advised to call insurance company to determine out of pocket expense if they have not yet received this vaccine. Advised may also receive vaccine at local pharmacy or Health Dept. Verbalized acceptance and understanding.  Screening Tests Health Maintenance  Topic Date Due  . TETANUS/TDAP  08/06/2023 (Originally 08/03/1969)  . INFLUENZA VACCINE  08/08/2019  . COLONOSCOPY  05/07/2024  . COVID-19 Vaccine  Completed  . Hepatitis C Screening  Completed  . PNA vac Low Risk Adult  Completed    Health Maintenance  There are no preventive care reminders to display for this patient.  Colorectal cancer screening: Completed 05/08/2014. Repeat every 10 years  Lung Cancer Screening: (Low Dose CT Chest recommended if Age 24-80 years, 30 pack-year currently smoking OR have quit w/in 15years.) does not qualify.    Additional Screening:  Hepatitis C Screening: does qualify; Completed 07/18/2017  Vision Screening: Recommended annual ophthalmology exams for early detection of glaucoma and other disorders of the eye. Is the patient up to date with their annual eye exam?  Yes  Who is the provider or what is the name of the office in which the patient attends annual eye exams? [facility], Dr. Lynnae Prude If pt is not established with a provider, would they like to be referred to a provider to establish care? No .   Dental Screening: Recommended annual dental exams for proper oral hygiene  Community Resource Referral / Chronic Care Management: CRR required this visit?  No   CCM required this visit?  No      Plan:     I have personally reviewed and noted the following in the patient's chart:   . Medical and social history . Use of alcohol, tobacco or illicit drugs  . Current medications and supplements . Functional ability and status . Nutritional status . Physical activity . Advanced directives . List of other physicians . Hospitalizations, surgeries, and ER  visits in previous 12 months . Vitals . Screenings to include cognitive, depression, and falls . Referrals and appointments  In addition, I have reviewed and discussed with patient certain preventive protocols, quality metrics, and best practice recommendations. A written personalized care plan for preventive services as well as general preventive health recommendations were provided to patient.   Due to this being a telephonic visit, the after visit summary with patients personalized plan was offered to patient via mail or my-chart. Patient preferred to pick up at office at next visit.   Janalyn Shy, LPN   [id number]

## 2019-08-06 NOTE — Progress Notes (Signed)
PCP notes:  Health Maintenance: Tdap- insurance/financial   Abnormal Screenings: none   Patient concerns: none   Nurse concerns: none   Next PCP appt: 08/27/2019 @ 9:40 am

## 2019-08-13 ENCOUNTER — Ambulatory Visit: Payer: Self-pay | Admitting: Primary Care

## 2019-08-17 DIAGNOSIS — M5416 Radiculopathy, lumbar region: Secondary | ICD-10-CM | POA: Diagnosis not present

## 2019-08-17 DIAGNOSIS — M9905 Segmental and somatic dysfunction of pelvic region: Secondary | ICD-10-CM | POA: Diagnosis not present

## 2019-08-17 DIAGNOSIS — M955 Acquired deformity of pelvis: Secondary | ICD-10-CM | POA: Diagnosis not present

## 2019-08-17 DIAGNOSIS — M9903 Segmental and somatic dysfunction of lumbar region: Secondary | ICD-10-CM | POA: Diagnosis not present

## 2019-08-26 DIAGNOSIS — H8103 Meniere's disease, bilateral: Secondary | ICD-10-CM | POA: Diagnosis not present

## 2019-08-26 DIAGNOSIS — H903 Sensorineural hearing loss, bilateral: Secondary | ICD-10-CM | POA: Diagnosis not present

## 2019-08-27 ENCOUNTER — Ambulatory Visit (INDEPENDENT_AMBULATORY_CARE_PROVIDER_SITE_OTHER): Payer: Medicare Other | Admitting: Primary Care

## 2019-08-27 ENCOUNTER — Other Ambulatory Visit: Payer: Self-pay

## 2019-08-27 ENCOUNTER — Encounter: Payer: Self-pay | Admitting: Primary Care

## 2019-08-27 VITALS — BP 130/82 | HR 67 | Temp 95.5°F | Ht 71.75 in | Wt 209.5 lb

## 2019-08-27 DIAGNOSIS — R739 Hyperglycemia, unspecified: Secondary | ICD-10-CM | POA: Diagnosis not present

## 2019-08-27 DIAGNOSIS — N529 Male erectile dysfunction, unspecified: Secondary | ICD-10-CM | POA: Diagnosis not present

## 2019-08-27 DIAGNOSIS — E785 Hyperlipidemia, unspecified: Secondary | ICD-10-CM | POA: Insufficient documentation

## 2019-08-27 DIAGNOSIS — H8109 Meniere's disease, unspecified ear: Secondary | ICD-10-CM | POA: Diagnosis not present

## 2019-08-27 NOTE — Patient Instructions (Signed)
Start exercising. You should be getting 150 minutes of moderate intensity exercise weekly.  It's important to improve your diet by reducing consumption of fast food, fried food, processed snack foods, sugary drinks. Increase consumption of fresh vegetables and fruits, whole grains, water.  Ensure you are drinking 64 ounces of water daily.  Please think about starting a low dose cholesterol medication to lower cholesterol and prevent a heart attack/stroke in the future.  Schedule a visit up front (at the END of a session) for mole removal.  It was a pleasure to see you today!

## 2019-08-27 NOTE — Assessment & Plan Note (Signed)
Initiated by ENT who is now wanting to wean him off of HCTZ. BP actually improved with HCTZ. He will start weaning as instructed and will monitor BP at home.  CMP reviewed.

## 2019-08-27 NOTE — Progress Notes (Signed)
Subjective:    Patient ID: Larry Bentley, male    DOB: 1950/10/10, 69 y.o.   MRN: [id number]  HPI  This visit occurred during the SARS-CoV-2 public health emergency.  Safety protocols were in place, including screening questions prior to the visit, additional usage of staff PPE, and extensive cleaning of exam room while observing appropriate contact time as indicated for disinfecting solutions.   Larry Bentley is a 69 year old male who presents today for MWV Part 2. He spoke with our health advisor last month.   Immunizations: -Influenza: Due this season  -Shingles: Never completed, not covered by the insurance -Pneumonia: Completed Prevnar and Pneumovax -Covid-19: Completed series  Colonoscopy: Completed in 2016, due in 2026 PSA: 0.40 in 2021 Hep C Screen: Negative  BP Readings from Last 3 Encounters:  08/27/19 130/82  06/08/19 140/70  08/05/18 140/70   The 10-year ASCVD risk score Denman George DC Jr., et al., 2013) is: 20.1%   Values used to calculate the score:     Age: 61 years     Sex: Male     Is Non-Hispanic African American: No     Diabetic: No     Tobacco smoker: No     Systolic Blood Pressure: 130 mmHg     Is BP treated: Yes     HDL Cholesterol: 56.5 mg/dL     Total Cholesterol: 196 mg/dL   Review of Systems  Respiratory: Negative for shortness of breath.   Cardiovascular: Negative for chest pain.  Gastrointestinal: Negative for constipation and diarrhea.  Neurological: Positive for dizziness. Negative for numbness and headaches.  Psychiatric/Behavioral: The patient is not nervous/anxious.        Past Medical History:  Diagnosis Date  . Hypertension   . Pneumonia   . Porphyria cutanea tarda (HCC)   . SBO (small bowel obstruction) (HCC)      Social History   Socioeconomic History  . Marital status: Married    Spouse name: Not on file  . Number of children: Not on file  . Years of education: Not on file  . Highest education level: Not on file    Occupational History  . Not on file  Tobacco Use  . Smoking status: Former Smoker    Quit date: 01/08/1987    Years since quitting: 32.6  . Smokeless tobacco: Never Used  Vaping Use  . Vaping Use: Never used  Substance and Sexual Activity  . Alcohol use: Yes    Alcohol/week: 15.0 standard drinks    Types: 15 Cans of beer per week  . Drug use: Not Currently  . Sexual activity: Not on file  Other Topics Concern  . Not on file  Social History Narrative   Married.   1 biologic child. 4 grandchildren.    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   Enjoys projects around the home, computers.    Social Determinants of Health   Financial Resource Strain: Low Risk   . Difficulty of Paying Living Expenses: Not hard at all  Food Insecurity: No Food Insecurity  . Worried About Programme researcher, broadcasting/film/video in the Last Year: Never true  . Ran Out of Food in the Last Year: Never true  Transportation Needs: No Transportation Needs  . Lack of Transportation (Medical): No  . Lack of Transportation (Non-Medical): No  Physical Activity: Inactive  . Days of Exercise per Week: 0 days  . Minutes of Exercise per Session: 0 min  Stress: No Stress Concern Present  .  Feeling of Stress : Not at all  Social Connections:   . Frequency of Communication with Friends and Family: Not on file  . Frequency of Social Gatherings with Friends and Family: Not on file  . Attends Religious Services: Not on file  . Active Member of Clubs or Organizations: Not on file  . Attends Banker Meetings: Not on file  . Marital Status: Not on file  Intimate Partner Violence: Not At Risk  . Fear of Current or Ex-Partner: No  . Emotionally Abused: No  . Physically Abused: No  . Sexually Abused: No    Past Surgical History:  Procedure Laterality Date  . HEMORROIDECTOMY  08/11/2014    Family History  Problem Relation Age of Onset  . Arthritis Mother   . Heart disease Mother   . Heart disease Father   .  Hyperlipidemia Father   . Hypertension Father   . Cancer Maternal Grandfather        brain  . Stroke Paternal Grandmother   . Cancer Paternal Grandfather        lung    No Known Allergies  Current Outpatient Medications on File Prior to Visit  Medication Sig Dispense Refill  . DENTA 5000 PLUS 1.1 % CREA dental cream USE TO BRUSH TEETH 1-2 TIMES PER DAY. NEITHER BRAND NOR GENERIC COVERED  1  . hydrochlorothiazide (HYDRODIURIL) 12.5 MG tablet Take 12.5 mg by mouth daily.    Marland Kitchen OVER THE COUNTER MEDICATION Met[employer]ics Men's vitality vitamin  Taking 1 packet by mouth once daily    . OVER THE COUNTER MEDICATION [employer] Sinuplex  Taking 1 tablet by mouth once daily    . Polyethylene Glycol 3350 (MIRALAX PO) Take by mouth.    . tadalafil (ADCIRCA/CIALIS) 20 MG tablet 1 tab 30-60 minutes prior to intercourse 30 tablet 2   No current facility-administered medications on file prior to visit.    BP 130/82   Pulse 67   Temp (!) 95.5 F (35.3 C) (Temporal)   Ht 5' 11.75" (1.822 m)   Wt 209 lb 8 oz (95 kg)   SpO2 100%   BMI 28.61 kg/m    Objective:   Physical Exam Cardiovascular:     Rate and Rhythm: Normal rate and regular rhythm.  Pulmonary:     Effort: Pulmonary effort is normal.     Breath sounds: Normal breath sounds.  Musculoskeletal:     Cervical back: Neck supple.  Skin:    General: Skin is warm and dry.     Comments: Benign appearing, raised, flesh colored lesion to left medial knee.             Assessment & Plan:

## 2019-08-27 NOTE — Assessment & Plan Note (Signed)
Following with Urology, overall doing well on Cialis. PSA unremarkable.

## 2019-08-27 NOTE — Assessment & Plan Note (Signed)
Fasting glucose of 101 again, A1C last year of 4.9. encouraged a healthy diet, regular exercise.

## 2019-08-27 NOTE — Assessment & Plan Note (Signed)
LDL of 117 with ASCVD risk score of 20%, discussed what this meant for patient. Recommend low dose statin therapy, he will think about this and update [place] via My Chart.

## 2019-09-10 ENCOUNTER — Encounter: Payer: Self-pay | Admitting: Primary Care

## 2019-09-10 ENCOUNTER — Ambulatory Visit (INDEPENDENT_AMBULATORY_CARE_PROVIDER_SITE_OTHER): Payer: Medicare Other | Admitting: Primary Care

## 2019-09-10 ENCOUNTER — Other Ambulatory Visit: Payer: Self-pay

## 2019-09-10 DIAGNOSIS — B079 Viral wart, unspecified: Secondary | ICD-10-CM | POA: Insufficient documentation

## 2019-09-10 NOTE — Assessment & Plan Note (Signed)
To left medial knee/lower extremity.  He consents to treatment with cryotherapy.  Pain ease utilized as analgesia. Cryotherapy completed. Patient tolerated well. Home care instructions provided.  He will update if no resolve.

## 2019-09-10 NOTE — Progress Notes (Signed)
Subjective:    Patient ID: Larry Bentley, male    DOB: Apr 20, 1950, 69 y.o.   MRN: [id number]  HPI  This visit occurred during the SARS-CoV-2 public health emergency.  Safety protocols were in place, including screening questions prior to the visit, additional usage of staff PPE, and extensive cleaning of exam room while observing appropriate contact time as indicated for disinfecting solutions.   Mr. Kingsley is a 69 year old male who presents today for removal of wart.  His wart is located to the left medial knee for which he's noticed for years. The wart snags on objects and clothing which is bothersome. He is electing for removal today.   Review of Systems  Skin: Negative for color change.       Wart        Past Medical History:  Diagnosis Date   Hypertension    Pneumonia    Porphyria cutanea tarda (HCC)    SBO (small bowel obstruction) (HCC)      Social History   Socioeconomic History   Marital status: Married    Spouse name: Not on file   Number of children: Not on file   Years of education: Not on file   Highest education level: Not on file  Occupational History   Not on file  Tobacco Use   Smoking status: Former Smoker    Quit date: 01/08/1987    Years since quitting: 32.6   Smokeless tobacco: Never Used  Vaping Use   Vaping Use: Never used  Substance and Sexual Activity   Alcohol use: Yes    Alcohol/week: 15.0 standard drinks    Types: 15 Cans of beer per week   Drug use: Not Currently   Sexual activity: Not on file  Other Topics Concern   Not on file  Social History Narrative   Married.   1 biologic child. 4 grandchildren.    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   Enjoys projects around the home, computers.    Social Determinants of Health   Financial Resource Strain: Low Risk    Difficulty of Paying Living Expenses: Not hard at all  Food Insecurity: No Food Insecurity   Worried About Programme researcher, broadcasting/film/video in the Last Year: Never  true   Ran Out of Food in the Last Year: Never true  Transportation Needs: No Transportation Needs   Lack of Transportation (Medical): No   Lack of Transportation (Non-Medical): No  Physical Activity: Inactive   Days of Exercise per Week: 0 days   Minutes of Exercise per Session: 0 min  Stress: No Stress Concern Present   Feeling of Stress : Not at all  Social Connections:    Frequency of Communication with Friends and Family: Not on file   Frequency of Social Gatherings with Friends and Family: Not on file   Attends Religious Services: Not on file   Active Member of Clubs or Organizations: Not on file   Attends Banker Meetings: Not on file   Marital Status: Not on file  Intimate Partner Violence: Not At Risk   Fear of Current or Ex-Partner: No   Emotionally Abused: No   Physically Abused: No   Sexually Abused: No    Past Surgical History:  Procedure Laterality Date   HEMORROIDECTOMY  08/11/2014    Family History  Problem Relation Age of Onset   Arthritis Mother    Heart disease Mother    Heart disease Father    Hyperlipidemia  Father    Hypertension Father    Cancer Maternal Grandfather        brain   Stroke Paternal Grandmother    Cancer Paternal Grandfather        lung    No Known Allergies  Current Outpatient Medications on File Prior to Visit  Medication Sig Dispense Refill   DENTA 5000 PLUS 1.1 % CREA dental cream USE TO BRUSH TEETH 1-2 TIMES PER DAY. NEITHER BRAND NOR GENERIC COVERED  1   hydrochlorothiazide (HYDRODIURIL) 12.5 MG tablet Take 12.5 mg by mouth daily.     OVER THE COUNTER MEDICATION [employer] Men's vitality vitamin  Taking 1 packet by mouth once daily     OVER THE COUNTER MEDICATION [employer] Sinuplex  Taking 1 tablet by mouth once daily     OVER THE COUNTER MEDICATION [employer] Zinc A..G 20mg , daily     OVER THE COUNTER MEDICATION [employer] D3 Liquid 25 mcg per drop, 3-4 drops daily      OVER THE COUNTER MEDICATION [employer] Ultra Potent-C 1000, daily     Polyethylene Glycol 3350 (MIRALAX PO) Take by mouth.     tadalafil (ADCIRCA/CIALIS) 20 MG tablet 1 tab 30-60 minutes prior to intercourse 30 tablet 2   No current facility-administered medications on file prior to visit.    BP 120/60    Pulse 61    Ht 5' 11.75" (1.822 m)    Wt 210 lb (95.3 kg)    SpO2 99%    BMI 28.68 kg/m    Objective:   Physical Exam Skin:    General: Skin is warm and dry.     Findings: No erythema.     Comments: Slightly raised, flesh colored wart to left medial knee. No erythema.   Neurological:     Mental Status: He is alert.            Assessment & Plan:

## 2019-09-10 NOTE — Patient Instructions (Signed)
Please return in one week if the wart has not fallen off.  It was a pleasure to see you today!

## 2019-09-16 DIAGNOSIS — M9903 Segmental and somatic dysfunction of lumbar region: Secondary | ICD-10-CM | POA: Diagnosis not present

## 2019-09-16 DIAGNOSIS — M9905 Segmental and somatic dysfunction of pelvic region: Secondary | ICD-10-CM | POA: Diagnosis not present

## 2019-09-16 DIAGNOSIS — M5416 Radiculopathy, lumbar region: Secondary | ICD-10-CM | POA: Diagnosis not present

## 2019-09-16 DIAGNOSIS — M955 Acquired deformity of pelvis: Secondary | ICD-10-CM | POA: Diagnosis not present

## 2019-10-14 DIAGNOSIS — M5416 Radiculopathy, lumbar region: Secondary | ICD-10-CM | POA: Diagnosis not present

## 2019-10-14 DIAGNOSIS — M9905 Segmental and somatic dysfunction of pelvic region: Secondary | ICD-10-CM | POA: Diagnosis not present

## 2019-10-14 DIAGNOSIS — M955 Acquired deformity of pelvis: Secondary | ICD-10-CM | POA: Diagnosis not present

## 2019-10-14 DIAGNOSIS — M9903 Segmental and somatic dysfunction of lumbar region: Secondary | ICD-10-CM | POA: Diagnosis not present

## 2019-11-10 NOTE — Progress Notes (Addendum)
11/11/2019 2:03 PM   Glennon Mac Sep 02, 1950 [id number]  Referring provider: Doreene Nest, NP [address] [place],  [place] [address] Chief Complaint  Patient presents with  . Erectile Dysfunction    HPI: Larry Bentley is a 69 y.o. male who returns for a 1 year follow up of erectile dysfunction.  -Patient had a history of worsening ED. -He had partial erections which were firm enough for penetration much less than 50% of the time.  -He had difficulty maintaining an erection. Denied pain or curvature. -PSA was 0.40 as of 08/06/2019.  -Patient reports tadalafil improved ED short term. -He has trouble ejaculating and erections are no longer firm. -Tadalafil no longer effective -Only new medication is HCTZ   PMH: Past Medical History:  Diagnosis Date  . Hypertension   . Pneumonia   . Porphyria cutanea tarda (HCC)   . SBO (small bowel obstruction) [facility])     Surgical History: Past Surgical History:  Procedure Laterality Date  . HEMORROIDECTOMY  08/11/2014    Home Medications:  Allergies as of 11/11/2019   No Known Allergies     Medication List       Accurate as of November 11, 2019  2:03 PM. If you have any questions, ask your nurse or doctor.        Denta 5000 Plus 1.1 % Crea dental cream Generic drug: sodium fluoride USE TO BRUSH TEETH 1-2 TIMES PER DAY. NEITHER BRAND NOR GENERIC COVERED   hydrochlorothiazide 12.5 MG tablet Commonly known as: HYDRODIURIL Take 12.5 mg by mouth daily.   MIRALAX PO Take by mouth.   OVER THE COUNTER MEDICATION [employer] Men's vitality vitamin  Taking 1 packet by mouth once daily   OVER THE COUNTER MEDICATION [employer] Sinuplex  Taking 1 tablet by mouth once daily   OVER THE COUNTER MEDICATION [employer] Zinc A..G 20mg , daily   OVER THE COUNTER MEDICATION [employer] D3 Liquid 25 mcg per drop, 3-4 drops daily   OVER THE COUNTER MEDICATION [employer] Ultra Potent-C 1000, daily   tadalafil 20 MG  tablet Commonly known as: CIALIS 1 tab 30-60 minutes prior to intercourse       Allergies: No Known Allergies  Family History: Family History  Problem Relation Age of Onset  . Arthritis Mother   . Heart disease Mother   . Heart disease Father   . Hyperlipidemia Father   . Hypertension Father   . Cancer Maternal Grandfather        brain  . Stroke Paternal Grandmother   . Cancer Paternal Grandfather        lung    Social History:  reports that he quit smoking about 32 years ago. He has never used smokeless tobacco. He reports current alcohol use of about 15.0 standard drinks of alcohol per week. He reports previous drug use.   Physical Exam: BP (!) 176/50   Pulse 80   Ht 5' 11.5" (1.816 m)   Wt 205 lb (93 kg)   BMI 28.19 kg/m   Constitutional:  Alert and oriented, No acute distress. HEENT: [place] AT, moist mucus membranes.  Trachea midline, no masses. Cardiovascular: No clubbing, cyanosis, or edema. Respiratory: Normal respiratory effort, no increased work of breathing. Skin: No rashes, bruises or suspicious lesions. Neurologic: Grossly intact, no focal deficits, moving all 4 extremities. Psychiatric: Normal mood and affect.  Laboratory Data:  Lab Results  Component Value Date   CREATININE 0.95 08/06/2019    Lab Results  Component Value  Date   PSA 0.40 08/06/2019   PSA 0.39 07/18/2017     Assessment & Plan:   1. Erectile dysfunction   Tadalafil effective initially  We discussed second line options including intracavernosal injections and vacuum erection devices, provided literature on intracavernosal injections  No significant organic risk factors and will check in a.m. testosterone level/LH   [facility] [address], [address] [place], [place] [address] [phone]  I, Theador Hawthorne, am acting as a scribe for Dr. Lorin Picket C. Chalsey Leeth,  I have reviewed the above documentation for accuracy and completeness, and I agree  with the above.    Riki Altes, MD

## 2019-11-11 ENCOUNTER — Other Ambulatory Visit: Payer: Self-pay

## 2019-11-11 ENCOUNTER — Ambulatory Visit (INDEPENDENT_AMBULATORY_CARE_PROVIDER_SITE_OTHER): Payer: Medicare Other | Admitting: Urology

## 2019-11-11 ENCOUNTER — Encounter: Payer: Self-pay | Admitting: Urology

## 2019-11-11 VITALS — BP 176/50 | HR 80 | Ht 71.5 in | Wt 205.0 lb

## 2019-11-11 DIAGNOSIS — M9903 Segmental and somatic dysfunction of lumbar region: Secondary | ICD-10-CM | POA: Diagnosis not present

## 2019-11-11 DIAGNOSIS — M9905 Segmental and somatic dysfunction of pelvic region: Secondary | ICD-10-CM | POA: Diagnosis not present

## 2019-11-11 DIAGNOSIS — N5201 Erectile dysfunction due to arterial insufficiency: Secondary | ICD-10-CM | POA: Diagnosis not present

## 2019-11-11 DIAGNOSIS — N529 Male erectile dysfunction, unspecified: Secondary | ICD-10-CM

## 2019-11-11 DIAGNOSIS — M955 Acquired deformity of pelvis: Secondary | ICD-10-CM | POA: Diagnosis not present

## 2019-11-11 DIAGNOSIS — M5416 Radiculopathy, lumbar region: Secondary | ICD-10-CM | POA: Diagnosis not present

## 2019-11-12 ENCOUNTER — Other Ambulatory Visit: Payer: Medicare Other

## 2019-11-12 DIAGNOSIS — N529 Male erectile dysfunction, unspecified: Secondary | ICD-10-CM

## 2019-11-13 LAB — TESTOSTERONE: Testosterone: 524 ng/dL (ref 264–916)

## 2019-11-13 LAB — LUTEINIZING HORMONE: LH: 7.9 m[IU]/mL (ref 1.7–8.6)

## 2019-11-14 NOTE — Progress Notes (Signed)
Testosterone level was normal at 524

## 2019-11-15 ENCOUNTER — Telehealth: Payer: Self-pay | Admitting: *Deleted

## 2019-11-15 NOTE — Telephone Encounter (Signed)
Left message on vm per DPR °

## 2019-11-15 NOTE — Telephone Encounter (Signed)
-----   Message from Riki Altes, MD sent at 11/14/2019 10:31 AM EST ----- Testosterone level was normal at 524

## 2019-11-26 DIAGNOSIS — H903 Sensorineural hearing loss, bilateral: Secondary | ICD-10-CM | POA: Diagnosis not present

## 2019-11-26 DIAGNOSIS — H8103 Meniere's disease, bilateral: Secondary | ICD-10-CM | POA: Diagnosis not present

## 2019-11-26 DIAGNOSIS — H90A22 Sensorineural hearing loss, unilateral, left ear, with restricted hearing on the contralateral side: Secondary | ICD-10-CM | POA: Diagnosis not present

## 2019-12-09 DIAGNOSIS — M955 Acquired deformity of pelvis: Secondary | ICD-10-CM | POA: Diagnosis not present

## 2019-12-09 DIAGNOSIS — M9903 Segmental and somatic dysfunction of lumbar region: Secondary | ICD-10-CM | POA: Diagnosis not present

## 2019-12-09 DIAGNOSIS — M9905 Segmental and somatic dysfunction of pelvic region: Secondary | ICD-10-CM | POA: Diagnosis not present

## 2019-12-09 DIAGNOSIS — M5416 Radiculopathy, lumbar region: Secondary | ICD-10-CM | POA: Diagnosis not present

## 2020-01-06 DIAGNOSIS — M5416 Radiculopathy, lumbar region: Secondary | ICD-10-CM | POA: Diagnosis not present

## 2020-01-06 DIAGNOSIS — M955 Acquired deformity of pelvis: Secondary | ICD-10-CM | POA: Diagnosis not present

## 2020-01-06 DIAGNOSIS — M9903 Segmental and somatic dysfunction of lumbar region: Secondary | ICD-10-CM | POA: Diagnosis not present

## 2020-01-06 DIAGNOSIS — M9905 Segmental and somatic dysfunction of pelvic region: Secondary | ICD-10-CM | POA: Diagnosis not present

## 2020-01-19 IMAGING — CT CT TEMPORAL BONES W/O CM
3 of 6 series · 15 of 40 positions shown, 18 images · non-contrast
Comparison: None.

CLINICAL DATA: Dysfunction of both eustachian tubes

EXAM:
CT TEMPORAL BONES WITHOUT CONTRAST
TECHNIQUE: Axial and coronal plane CT imaging of the petrous temporal bones was
performed with thin-collimation image reconstruction. No intravenous
contrast was administered. Multiplanar CT image reconstructions were
also generated.

[Series 3: ax soft temperal bones 2.00 · axial · 0.33mm/px · z∈[-555,-531]mm · 2 of 38 slices shown]
[im 13/38  brain]
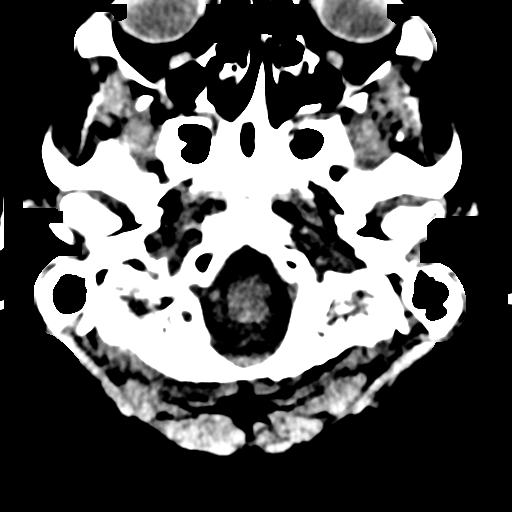
[im 25/38  brain]
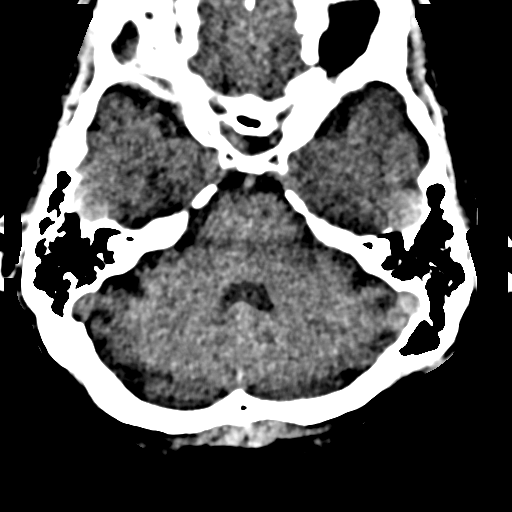

[Series 8: ax mag right temperal bones 0.60 · axial · 0.20mm/px · z∈[-573,-510]mm · 11 of 128 slices shown, 14 images]
[im 11/128  brain]
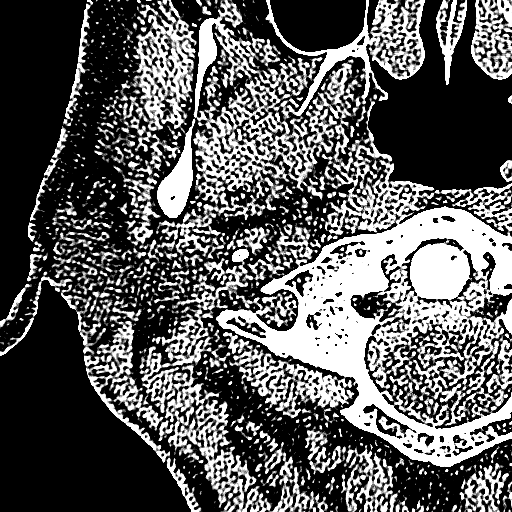
[im 11/128  bone]
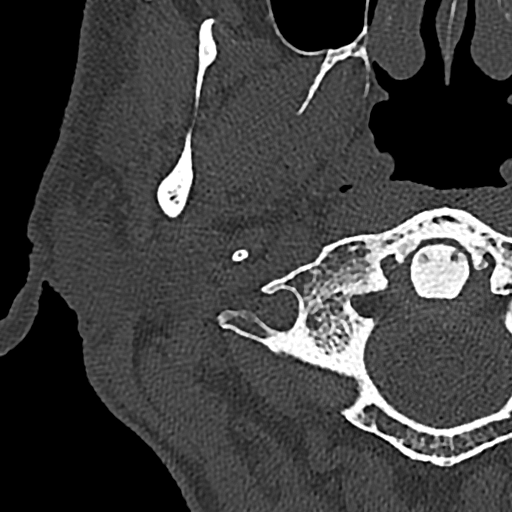
[im 22/128  bone]
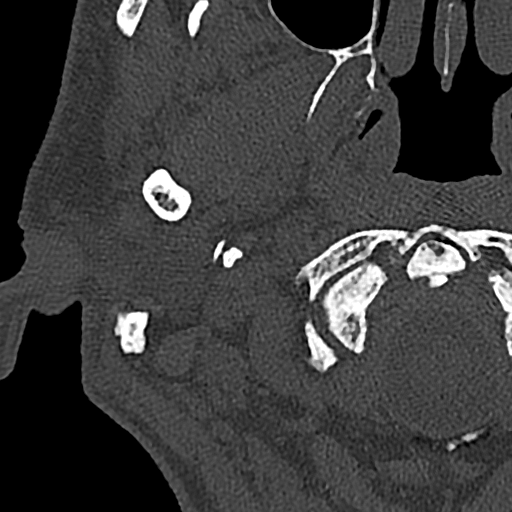
[im 32/128  bone]
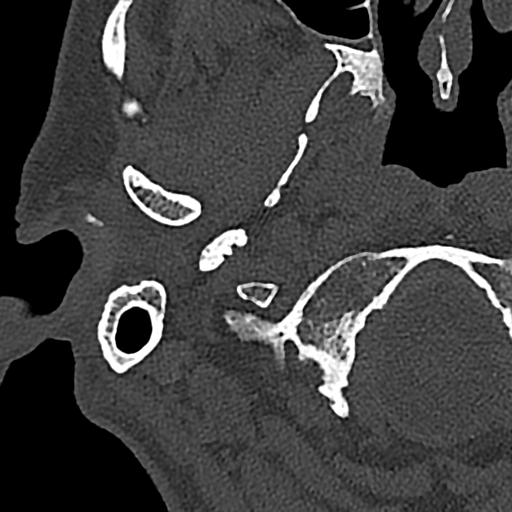
[im 43/128  bone]
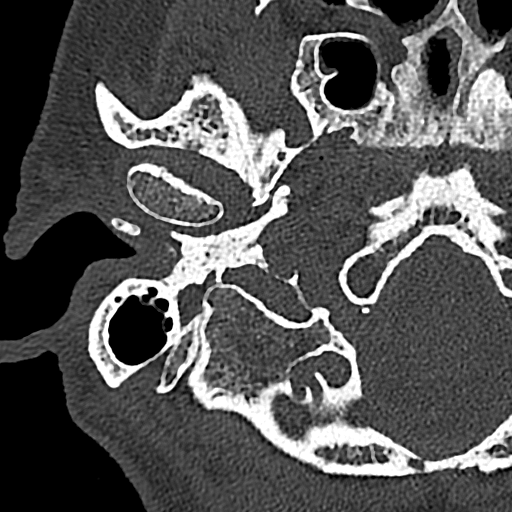
[im 53/128  brain]
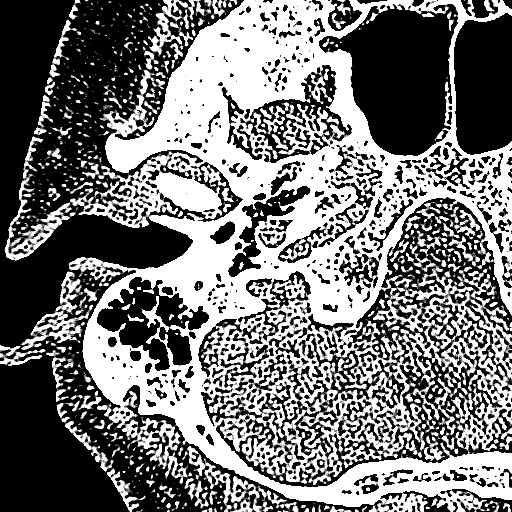
[im 53/128  bone]
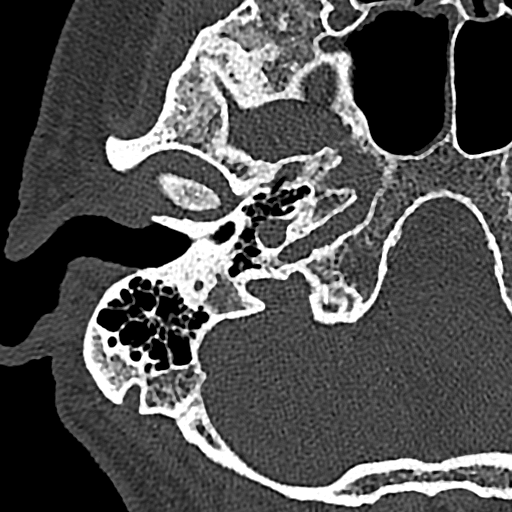
[im 64/128  bone]
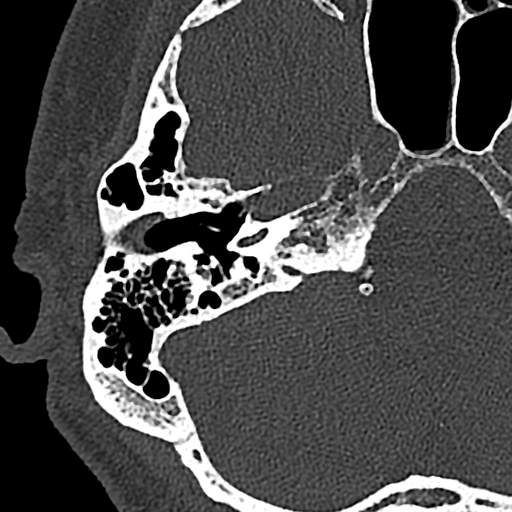
[im 75/128  bone]
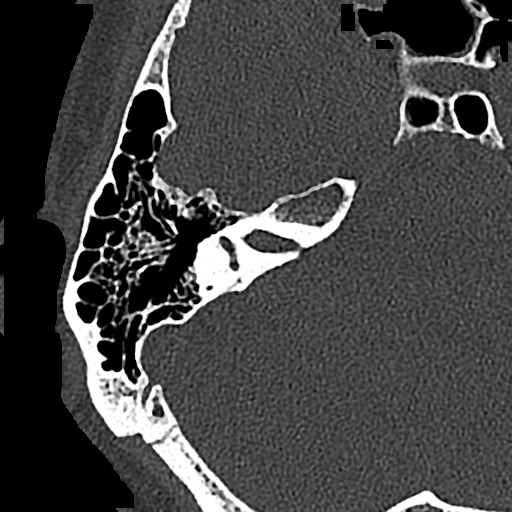
[im 85/128  bone]
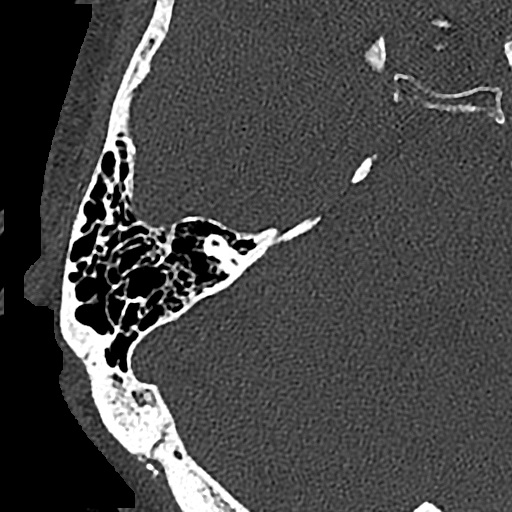
[im 96/128  brain]
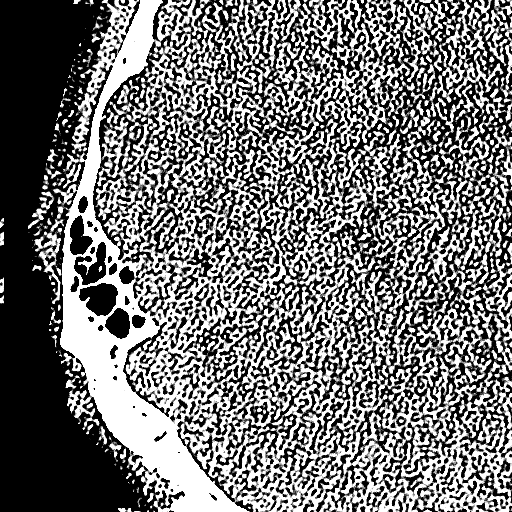
[im 96/128  bone]
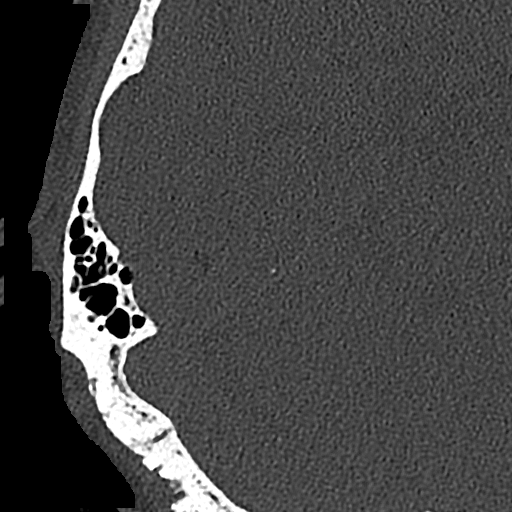
[im 106/128  bone]
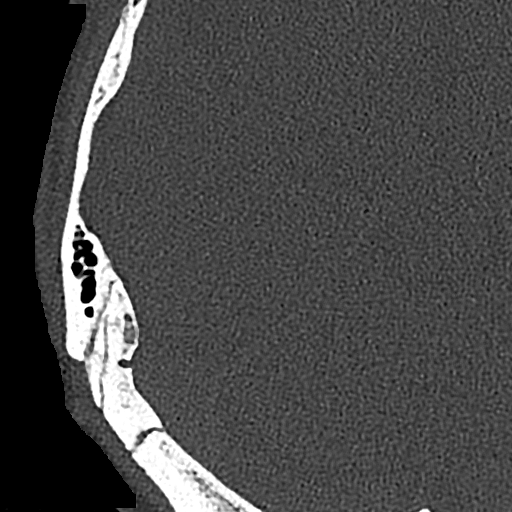
[im 117/128  bone]
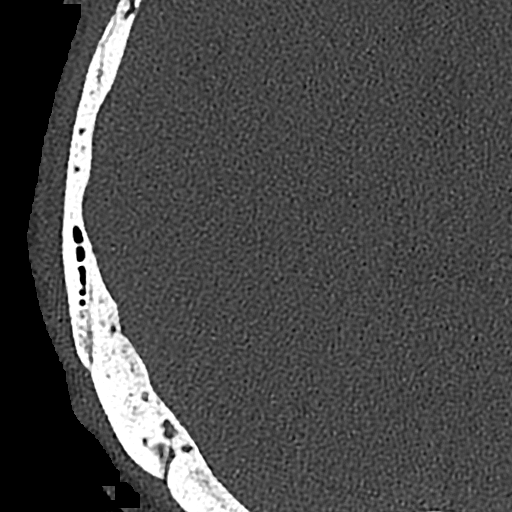

[Series 9: cor mag right temperal bones 0.80 cor · coronal · 0.15mm/px · 2 of 125 slices shown]
[im 42/125  bone]
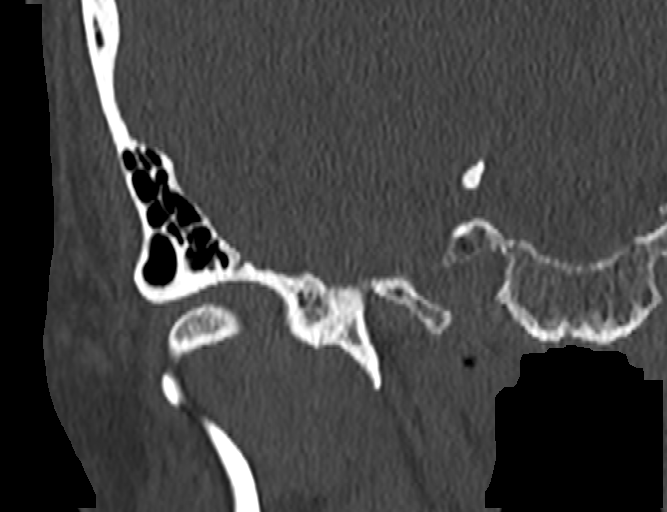
[im 83/125  bone]
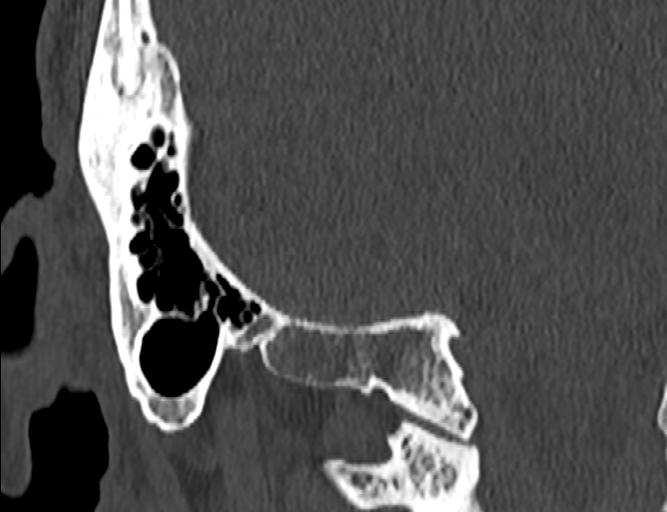

[15 of 40 positions shown; findings below may reference images not displayed]

FINDINGS: Right temporal bone:

External auditory canal and tympanic membrane are unremarkable.
Middle ear cleft and ossicles are unremarkable. Cochlea and
semicircular canals are unremarkable. Tegmen is intact. Facial nerve
demonstrates normal course. Mastoid air cells are clear.

Left temporal bone:

External auditory canal and tympanic membrane are unremarkable.
Middle ear cleft and ossicles are unremarkable. Cochlea and
semicircular canals are unremarkable. Tegmen is intact. Facial nerve
demonstrates normal course. Mastoid air cells are clear.

Partially imaged paranasal sinus mucosal thickening with greatest
involvement of the ethmoids. Temporomandibular joints are
unremarkable.
IMPRESSION: Normal imaging of the temporal bones.

## 2020-02-03 DIAGNOSIS — M955 Acquired deformity of pelvis: Secondary | ICD-10-CM | POA: Diagnosis not present

## 2020-02-03 DIAGNOSIS — M5416 Radiculopathy, lumbar region: Secondary | ICD-10-CM | POA: Diagnosis not present

## 2020-02-03 DIAGNOSIS — M9905 Segmental and somatic dysfunction of pelvic region: Secondary | ICD-10-CM | POA: Diagnosis not present

## 2020-02-03 DIAGNOSIS — M9903 Segmental and somatic dysfunction of lumbar region: Secondary | ICD-10-CM | POA: Diagnosis not present

## 2020-03-02 DIAGNOSIS — M5416 Radiculopathy, lumbar region: Secondary | ICD-10-CM | POA: Diagnosis not present

## 2020-03-02 DIAGNOSIS — M9903 Segmental and somatic dysfunction of lumbar region: Secondary | ICD-10-CM | POA: Diagnosis not present

## 2020-03-02 DIAGNOSIS — M9905 Segmental and somatic dysfunction of pelvic region: Secondary | ICD-10-CM | POA: Diagnosis not present

## 2020-03-02 DIAGNOSIS — M955 Acquired deformity of pelvis: Secondary | ICD-10-CM | POA: Diagnosis not present

## 2020-03-30 DIAGNOSIS — M5416 Radiculopathy, lumbar region: Secondary | ICD-10-CM | POA: Diagnosis not present

## 2020-03-30 DIAGNOSIS — M9905 Segmental and somatic dysfunction of pelvic region: Secondary | ICD-10-CM | POA: Diagnosis not present

## 2020-03-30 DIAGNOSIS — M9903 Segmental and somatic dysfunction of lumbar region: Secondary | ICD-10-CM | POA: Diagnosis not present

## 2020-03-30 DIAGNOSIS — M955 Acquired deformity of pelvis: Secondary | ICD-10-CM | POA: Diagnosis not present

## 2020-05-04 DIAGNOSIS — M955 Acquired deformity of pelvis: Secondary | ICD-10-CM | POA: Diagnosis not present

## 2020-05-04 DIAGNOSIS — M9903 Segmental and somatic dysfunction of lumbar region: Secondary | ICD-10-CM | POA: Diagnosis not present

## 2020-05-04 DIAGNOSIS — M9905 Segmental and somatic dysfunction of pelvic region: Secondary | ICD-10-CM | POA: Diagnosis not present

## 2020-05-04 DIAGNOSIS — M5416 Radiculopathy, lumbar region: Secondary | ICD-10-CM | POA: Diagnosis not present

## 2020-06-01 DIAGNOSIS — M9903 Segmental and somatic dysfunction of lumbar region: Secondary | ICD-10-CM | POA: Diagnosis not present

## 2020-06-01 DIAGNOSIS — M5416 Radiculopathy, lumbar region: Secondary | ICD-10-CM | POA: Diagnosis not present

## 2020-06-01 DIAGNOSIS — M9905 Segmental and somatic dysfunction of pelvic region: Secondary | ICD-10-CM | POA: Diagnosis not present

## 2020-06-01 DIAGNOSIS — M955 Acquired deformity of pelvis: Secondary | ICD-10-CM | POA: Diagnosis not present

## 2020-06-09 DIAGNOSIS — H903 Sensorineural hearing loss, bilateral: Secondary | ICD-10-CM | POA: Diagnosis not present

## 2020-06-09 DIAGNOSIS — H8103 Meniere's disease, bilateral: Secondary | ICD-10-CM | POA: Diagnosis not present

## 2020-07-06 DIAGNOSIS — M9905 Segmental and somatic dysfunction of pelvic region: Secondary | ICD-10-CM | POA: Diagnosis not present

## 2020-07-06 DIAGNOSIS — M9903 Segmental and somatic dysfunction of lumbar region: Secondary | ICD-10-CM | POA: Diagnosis not present

## 2020-07-06 DIAGNOSIS — M955 Acquired deformity of pelvis: Secondary | ICD-10-CM | POA: Diagnosis not present

## 2020-07-06 DIAGNOSIS — M5416 Radiculopathy, lumbar region: Secondary | ICD-10-CM | POA: Diagnosis not present

## 2020-08-03 DIAGNOSIS — M955 Acquired deformity of pelvis: Secondary | ICD-10-CM | POA: Diagnosis not present

## 2020-08-03 DIAGNOSIS — M9905 Segmental and somatic dysfunction of pelvic region: Secondary | ICD-10-CM | POA: Diagnosis not present

## 2020-08-03 DIAGNOSIS — M5416 Radiculopathy, lumbar region: Secondary | ICD-10-CM | POA: Diagnosis not present

## 2020-08-03 DIAGNOSIS — M9903 Segmental and somatic dysfunction of lumbar region: Secondary | ICD-10-CM | POA: Diagnosis not present

## 2020-08-07 ENCOUNTER — Ambulatory Visit (INDEPENDENT_AMBULATORY_CARE_PROVIDER_SITE_OTHER): Payer: Medicare Other

## 2020-08-07 DIAGNOSIS — Z0000 Encounter for general adult medical examination without abnormal findings: Secondary | ICD-10-CM

## 2020-08-07 NOTE — Progress Notes (Signed)
PCP notes:  Health Maintenance: Shingrix- due    Abnormal Screenings: none   Patient concerns: hemorrhoids   Nurse concerns: none   Next PCP appt.: 08/11/2020 @ 11:40 am

## 2020-08-07 NOTE — Patient Instructions (Signed)
Larry Bentley , Thank you for taking time to come for your Medicare Wellness Visit. I appreciate your ongoing commitment to your health goals. Please review the following plan we discussed and let me know if I can assist you in the future.   Screening recommendations/referrals: Colonoscopy: Up to date, completed 05/08/2014, due 05/2024 Recommended yearly ophthalmology/optometry visit for glaucoma screening and checkup Recommended yearly dental visit for hygiene and checkup  Vaccinations: Influenza vaccine: due Fall 2022 Pneumococcal vaccine: Completed series Tdap vaccine: decline-insurance Shingles vaccine: due, check with your insurance regarding coverage if interested    Covid-19: completed 2 vaccines   Advanced directives: Please bring a copy of your POA (Power of Attorney) and/or Living Will to your next appointment.   Conditions/risks identified: hyperlipidemia   Next appointment: Follow up in one year for your annual wellness visit.   Preventive Care 71 Years and Older, Male Preventive care refers to lifestyle choices and visits with your health care provider that can promote health and wellness. What does preventive care include? A yearly physical exam. This is also called an annual well check. Dental exams once or twice a year. Routine eye exams. Ask your health care provider how often you should have your eyes checked. Personal lifestyle choices, including: Daily care of your teeth and gums. Regular physical activity. Eating a healthy diet. Avoiding tobacco and drug use. Limiting alcohol use. Practicing safe sex. Taking low doses of aspirin every day. Taking vitamin and mineral supplements as recommended by your health care provider. What happens during an annual well check? The services and screenings done by your health care provider during your annual well check will depend on your age, overall health, lifestyle risk factors, and family history of disease. Counseling  Your  health care provider may ask you questions about your: Alcohol use. Tobacco use. Drug use. Emotional well-being. Home and relationship well-being. Sexual activity. Eating habits. History of falls. Memory and ability to understand (cognition). Work and work Astronomer. Screening  You may have the following tests or measurements: Height, weight, and BMI. Blood pressure. Lipid and cholesterol levels. These may be checked every 5 years, or more frequently if you are over 71 years old. Skin check. Lung cancer screening. You may have this screening every year starting at age 71 if you have a 30-pack-year history of smoking and currently smoke or have quit within the past 15 years. Fecal occult blood test (FOBT) of the stool. You may have this test every year starting at age 71. Flexible sigmoidoscopy or colonoscopy. You may have a sigmoidoscopy every 5 years or a colonoscopy every 10 years starting at age 71. Prostate cancer screening. Recommendations will vary depending on your family history and other risks. Hepatitis C blood test. Hepatitis B blood test. Sexually transmitted disease (STD) testing. Diabetes screening. This is done by checking your blood sugar (glucose) after you have not eaten for a while (fasting). You may have this done every 1-3 years. Abdominal aortic aneurysm (AAA) screening. You may need this if you are a current or former smoker. Osteoporosis. You may be screened starting at age 71 if you are at high risk. Talk with your health care provider about your test results, treatment options, and if necessary, the need for more tests. Vaccines  Your health care provider may recommend certain vaccines, such as: Influenza vaccine. This is recommended every year. Tetanus, diphtheria, and acellular pertussis (Tdap, Td) vaccine. You may need a Td booster every 10 years. Zoster vaccine. You may need  this after age 71. Pneumococcal 13-valent conjugate (PCV13) vaccine. One dose  is recommended after age 71. Pneumococcal polysaccharide (PPSV23) vaccine. One dose is recommended after age 71. Talk to your health care provider about which screenings and vaccines you need and how often you need them. This information is not intended to replace advice given to you by your health care provider. Make sure you discuss any questions you have with your health care provider. Document Released: 01/20/2015 Document Revised: 09/13/2015 Document Reviewed: 10/25/2014 Elsevier Interactive Patient Education  2017 [place] Prevention in the Home Falls can cause injuries. They can happen to people of all ages. There are many things you can do to make your home safe and to help prevent falls. What can I do on the outside of my home? Regularly fix the edges of walkways and driveways and fix any cracks. Remove anything that might make you trip as you walk through a door, such as a raised step or threshold. Trim any bushes or trees on the path to your home. Use bright outdoor lighting. Clear any walking paths of anything that might make someone trip, such as rocks or tools. Regularly check to see if handrails are loose or broken. Make sure that both sides of any steps have handrails. Any raised decks and porches should have guardrails on the edges. Have any leaves, snow, or ice cleared regularly. Use sand or salt on walking paths during winter. Clean up any spills in your garage right away. This includes oil or grease spills. What can I do in the bathroom? Use night lights. Install grab bars by the toilet and in the tub and shower. Do not use towel bars as grab bars. Use non-skid mats or decals in the tub or shower. If you need to sit down in the shower, use a plastic, non-slip stool. Keep the floor dry. Clean up any water that spills on the floor as soon as it happens. Remove soap buildup in the tub or shower regularly. Attach bath mats securely with double-sided non-slip rug  tape. Do not have throw rugs and other things on the floor that can make you trip. What can I do in the bedroom? Use night lights. Make sure that you have a light by your bed that is easy to reach. Do not use any sheets or blankets that are too big for your bed. They should not hang down onto the floor. Have a firm chair that has side arms. You can use this for support while you get dressed. Do not have throw rugs and other things on the floor that can make you trip. What can I do in the kitchen? Clean up any spills right away. Avoid walking on wet floors. Keep items that you use a lot in easy-to-reach places. If you need to reach something above you, use a strong step stool that has a grab bar. Keep electrical cords out of the way. Do not use floor polish or wax that makes floors slippery. If you must use wax, use non-skid floor wax. Do not have throw rugs and other things on the floor that can make you trip. What can I do with my stairs? Do not leave any items on the stairs. Make sure that there are handrails on both sides of the stairs and use them. Fix handrails that are broken or loose. Make sure that handrails are as long as the stairways. Check any carpeting to make sure that it is firmly attached to  the stairs. Fix any carpet that is loose or worn. Avoid having throw rugs at the top or bottom of the stairs. If you do have throw rugs, attach them to the floor with carpet tape. Make sure that you have a light switch at the top of the stairs and the bottom of the stairs. If you do not have them, ask someone to add them for you. What else can I do to help prevent falls? Wear shoes that: Do not have high heels. Have rubber bottoms. Are comfortable and fit you well. Are closed at the toe. Do not wear sandals. If you use a stepladder: Make sure that it is fully opened. Do not climb a closed stepladder. Make sure that both sides of the stepladder are locked into place. Ask someone to  hold it for you, if possible. Clearly mark and make sure that you can see: Any grab bars or handrails. First and last steps. Where the edge of each step is. Use tools that help you move around (mobility aids) if they are needed. These include: Canes. Walkers. Scooters. Crutches. Turn on the lights when you go into a dark area. Replace any light bulbs as soon as they burn out. Set up your furniture so you have a clear path. Avoid moving your furniture around. If any of your floors are uneven, fix them. If there are any pets around you, be aware of where they are. Review your medicines with your doctor. Some medicines can make you feel dizzy. This can increase your chance of falling. Ask your doctor what other things that you can do to help prevent falls. This information is not intended to replace advice given to you by your health care provider. Make sure you discuss any questions you have with your health care provider. Document Released: 10/20/2008 Document Revised: 06/01/2015 Document Reviewed: 01/28/2014 Elsevier Interactive Patient Education  2017 [employer].

## 2020-08-07 NOTE — Progress Notes (Signed)
Subjective:   Larry Bentley is a 70 y.o. male who presents for Medicare Annual/Subsequent preventive examination.  Review of Systems: N/A      I connected with the patient today by telephone and verified that I am speaking with the correct person using two identifiers. Location patient: home Location nurse: work Persons participating in the telephone visit: patient, nurse.   I discussed the limitations, risks, security and privacy concerns of performing an evaluation and management service by telephone and the availability of in person appointments. I also discussed with the patient that there may be a patient responsible charge related to this service. The patient expressed understanding and verbally consented to this telephonic visit.        Cardiac Risk Factors include: advanced age (>6155men, 61>65 women);male gender;Other (see comment), Risk factor comments: hyperlipidemia     Objective:    Today's Vitals   There is no height or weight on file to calculate BMI.  Advanced Directives 08/07/2020 08/06/2019 07/17/2017  Does Patient Have a Medical Advance Directive? Yes No No  Type of Estate agentAdvance Directive Healthcare Power of [place];Living will - -  Copy of Healthcare Power of Attorney in Chart? No - copy requested - -  Would patient like information on creating a medical advance directive? - No - Patient declined No - Patient declined    Current Medications (verified) Outpatient Encounter Medications as of 08/07/2020  Medication Sig   DENTA 5000 PLUS 1.1 % CREA dental cream USE TO BRUSH TEETH 1-2 TIMES PER DAY. NEITHER BRAND NOR GENERIC COVERED   hydrochlorothiazide (HYDRODIURIL) 12.5 MG tablet Take 12.5 mg by mouth. 1 tablet every 3 days   OVER THE COUNTER MEDICATION [employer] Men's vitality vitamin  Taking 1 packet by mouth once daily   OVER THE COUNTER MEDICATION [employer] Sinuplex  Taking 1 tablet by mouth once daily   OVER THE COUNTER MEDICATION [employer] Zinc A..G 20mg ,  daily   OVER THE COUNTER MEDICATION [employer] D3 Liquid 25 mcg per drop, 3-4 drops daily   OVER THE COUNTER MEDICATION [employer] Ultra Potent-C 1000, daily   Polyethylene Glycol 3350 (MIRALAX PO) Take by mouth.   tadalafil (ADCIRCA/CIALIS) 20 MG tablet 1 tab 30-60 minutes prior to intercourse   No facility-administered encounter medications on file as of 08/07/2020.    Allergies (verified) Patient has no known allergies.   History: Past Medical History:  Diagnosis Date   Hypertension    Pneumonia    Porphyria cutanea tarda (HCC)    SBO (small bowel obstruction) (HCC)    Past Surgical History:  Procedure Laterality Date   HEMORROIDECTOMY  08/11/2014   Family History  Problem Relation Age of Onset   Arthritis Mother    Heart disease Mother    Heart disease Father    Hyperlipidemia Father    Hypertension Father    Cancer Maternal Grandfather        brain   Stroke Paternal Grandmother    Cancer Paternal Grandfather        lung   Social History   Socioeconomic History   Marital status: Married    Spouse name: Not on file   Number of children: Not on file   Years of education: Not on file   Highest education level: Not on file  Occupational History   Not on file  Tobacco Use   Smoking status: Former    Types: Cigarettes    Quit date: 01/08/1987    Years since quitting: 33.6   Smokeless  tobacco: Never  Vaping Use   Vaping Use: Never used  Substance and Sexual Activity   Alcohol use: Yes    Alcohol/week: 10.0 standard drinks    Types: 10 Cans of beer per week   Drug use: Not Currently   Sexual activity: Not on file  Other Topics Concern   Not on file  Social History Narrative   Married.   1 biologic child. 4 grandchildren.    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   Enjoys projects around the home, computers.    Social Determinants of Health   Financial Resource Strain: Low Risk    Difficulty of Paying Living Expenses: Not hard at all  Food Insecurity: No  Food Insecurity   Worried About Programme researcher, broadcasting/film/video in the Last Year: Never true   Ran Out of Food in the Last Year: Never true  Transportation Needs: No Transportation Needs   Lack of Transportation (Medical): No   Lack of Transportation (Non-Medical): No  Physical Activity: Inactive   Days of Exercise per Week: 0 days   Minutes of Exercise per Session: 0 min  Stress: No Stress Concern Present   Feeling of Stress : Not at all  Social Connections: Not on file    Tobacco Counseling Counseling given: Not Answered   Clinical Intake:  Pre-visit preparation completed: Yes  Pain : No/denies pain     Nutritional Risks: None Diabetes: No  How often do you need to have someone help you when you read instructions, pamphlets, or other written materials from your doctor or pharmacy?: 1 - Never  Diabetic: No Nutrition Risk Assessment:  Has the patient had any N/V/D within the last 2 months?  No  Does the patient have any non-healing wounds?  No  Has the patient had any unintentional weight loss or weight gain?  No   Diabetes:  Is the patient diabetic?  No  If diabetic, was a CBG obtained today?   N/A Did the patient bring in their glucometer from home?   N/A How often do you monitor your CBG's? N/A.   Financial Strains and Diabetes Management:  Are you having any financial strains with the device, your supplies or your medication?  N/A .  Does the patient want to be seen by Chronic Care Management for management of their diabetes?   N/A Would the patient like to be referred to a Nutritionist or for Diabetic Management?   N/A   Interpreter Needed?: No  Information entered by :: CJohnson, RN   Activities of Daily Living In your present state of health, do you have any difficulty performing the following activities: 08/07/2020  Hearing? Y  Comment wears hearing aids  Vision? N  Difficulty concentrating or making decisions? N  Walking or climbing stairs? N  Dressing or  bathing? N  Doing errands, shopping? N  Preparing Food and eating ? N  Using the Toilet? N  In the past six months, have you accidently leaked urine? N  Do you have problems with loss of bowel control? N  Managing your Medications? N  Managing your Finances? N  Housekeeping or managing your Housekeeping? N  Some recent data might be hidden    Patient Care Team: Doreene Nest, NP as PCP - General (Internal Medicine) Blair Promise, OD as Referring Physician (Optometry)  Indicate any recent Medical Services you may have received from other than [facility] providers in the past year (date may be approximate).     Assessment:  This is a routine wellness examination for Larry Bentley.  Hearing/Vision screen Vision Screening - Comments:: Patient gets annual eye exams   Dietary issues and exercise activities discussed: Current Exercise Habits: The patient does not participate in regular exercise at present, Exercise limited by: None identified   Goals Addressed             This Visit's Progress    Patient Stated       08/07/2020, I will maintain and continue medications as prescribed.        Depression Screen PHQ 2/9 Scores 08/07/2020 08/06/2019 08/05/2018 07/17/2017  PHQ - 2 Score 0 0 0 0  PHQ- 9 Score 0 0 - 0    Fall Risk Fall Risk  08/07/2020 08/06/2019 12/02/2018 08/05/2018 07/17/2017  Falls in the past year? 0 0 0 0 No  Comment - - Emmi Telephone Survey: data to providers prior to load - -  Number falls in past yr: 0 0 - 0 -  Injury with Fall? 0 0 - 0 -  Risk for fall due to : Medication side effect Medication side effect - - -  Follow up Falls evaluation completed;Falls prevention discussed Falls evaluation completed;Falls prevention discussed - - -    FALL RISK PREVENTION PERTAINING TO THE HOME:  Any stairs in or around the home? Yes  If so, are there any without handrails? No  Home free of loose throw rugs in walkways, pet beds, electrical cords, etc? Yes  Adequate  lighting in your home to reduce risk of falls? Yes   ASSISTIVE DEVICES UTILIZED TO PREVENT FALLS:  Life alert? No  Use of a cane, walker or w/c? No  Grab bars in the bathroom? No  Shower chair or bench in shower? No  Elevated toilet seat or a handicapped toilet? No   TIMED UP AND GO:  Was the test performed?  N/A telephone visit .    Cognitive Function: MMSE - Mini Mental State Exam 08/07/2020 08/06/2019 07/17/2017  Orientation to time 5 5 5   Orientation to Place 5 5 5   Registration 3 3 3   Attention/ Calculation 5 5 0  Recall 3 3 3   Language- name 2 objects - - 0  Language- repeat 1 1 1   Language- follow 3 step command - - 3  Language- read & follow direction - - 0  Write a sentence - - 0  Copy design - - 0  Total score - - 20  Mini Cog  Mini-Cog screen was completed. Maximum score is 22. A value of 0 denotes this part of the MMSE was not completed or the patient failed this part of the Mini-Cog screening.       Immunizations Immunization History  Administered Date(s) Administered   PFIZER(Purple Top)SARS-COV-2 Vaccination 03/30/2019, 04/27/2019   Pneumococcal Conjugate-13 07/17/2017   Pneumococcal Polysaccharide-23 08/05/2018    TDAP status: Due, Education has been provided regarding the importance of this vaccine. Advised may receive this vaccine at local pharmacy or Health Dept. Aware to provide a copy of the vaccination record if obtained from local pharmacy or Health Dept. Verbalized acceptance and understanding.  Flu Vaccine status: due Fall 2022  Pneumococcal vaccine status: Up to date  Covid-19 vaccine status: Completed 2 vaccines  Qualifies for Shingles Vaccine? Yes   Zostavax completed No   Shingrix Completed?: No.    Education has been provided regarding the importance of this vaccine. Patient has been advised to call insurance company to determine out of pocket expense if they  have not yet received this vaccine. Advised may also receive vaccine at local  pharmacy or Health Dept. Verbalized acceptance and understanding.  Screening Tests Health Maintenance  Topic Date Due   Zoster Vaccines- Shingrix (1 of 2) Never done   COVID-19 Vaccine (3 - Booster for Pfizer series) 09/27/2019   INFLUENZA VACCINE  08/07/2020   TETANUS/TDAP  08/06/2023 (Originally 08/03/1969)   COLONOSCOPY (Pts 45-8yrs Insurance coverage will need to be confirmed)  05/07/2024   Hepatitis C Screening  Completed   PNA vac Low Risk Adult  Completed   HPV VACCINES  Aged Out    Health Maintenance  Health Maintenance Due  Topic Date Due   Zoster Vaccines- Shingrix (1 of 2) Never done   COVID-19 Vaccine (3 - Booster for Pfizer series) 09/27/2019   INFLUENZA VACCINE  08/07/2020    Colorectal cancer screening: Type of screening: Colonoscopy. Completed 05/08/2014. Repeat every 10 years  Lung Cancer Screening: (Low Dose CT Chest recommended if Age 39-80 years, 30 pack-year currently smoking OR have quit w/in 15 years.) does not qualify.    Additional Screening:  Hepatitis C Screening: does qualify; Completed 07/18/2017  Vision Screening: Recommended annual ophthalmology exams for early detection of glaucoma and other disorders of the eye. Is the patient up to date with their annual eye exam?  Yes  Who is the provider or what is the name of the office in which the patient attends annual eye exams? [facility], Dr. Lew Dawes If pt is not established with a provider, would they like to be referred to a provider to establish care? No .   Dental Screening: Recommended annual dental exams for proper oral hygiene  Community Resource Referral / Chronic Care Management: CRR required this visit?  No   CCM required this visit?  No      Plan:     I have personally reviewed and noted the following in the patient's chart:   Medical and social history Use of alcohol, tobacco or illicit drugs  Current medications and supplements including opioid prescriptions. Patient is not  currently taking opioid prescriptions. Functional ability and status Nutritional status Physical activity Advanced directives List of other physicians Hospitalizations, surgeries, and ER visits in previous 12 months Vitals Screenings to include cognitive, depression, and falls Referrals and appointments  In addition, I have reviewed and discussed with patient certain preventive protocols, quality metrics, and best practice recommendations. A written personalized care plan for preventive services as well as general preventive health recommendations were provided to patient.   Due to this being a telephonic visit, the after visit summary with patients personalized plan was offered to patient via office or my-chart. Patient preferred to pick up at office at next visit or via mychart.   Janalyn Shy, LPN   01/12/1094

## 2020-08-11 ENCOUNTER — Encounter: Payer: Self-pay | Admitting: Primary Care

## 2020-08-11 ENCOUNTER — Other Ambulatory Visit: Payer: Self-pay

## 2020-08-11 ENCOUNTER — Ambulatory Visit (INDEPENDENT_AMBULATORY_CARE_PROVIDER_SITE_OTHER): Payer: Medicare Other | Admitting: Primary Care

## 2020-08-11 VITALS — BP 138/68 | HR 56 | Temp 97.7°F | Ht 72.0 in | Wt 210.0 lb

## 2020-08-11 DIAGNOSIS — N5201 Erectile dysfunction due to arterial insufficiency: Secondary | ICD-10-CM

## 2020-08-11 DIAGNOSIS — H8109 Meniere's disease, unspecified ear: Secondary | ICD-10-CM | POA: Diagnosis not present

## 2020-08-11 DIAGNOSIS — Z125 Encounter for screening for malignant neoplasm of prostate: Secondary | ICD-10-CM

## 2020-08-11 DIAGNOSIS — E785 Hyperlipidemia, unspecified: Secondary | ICD-10-CM | POA: Diagnosis not present

## 2020-08-11 DIAGNOSIS — R739 Hyperglycemia, unspecified: Secondary | ICD-10-CM

## 2020-08-11 LAB — COMPREHENSIVE METABOLIC PANEL
ALT: 19 U/L (ref 0–53)
AST: 22 U/L (ref 0–37)
Albumin: 4.6 g/dL (ref 3.5–5.2)
Alkaline Phosphatase: 37 U/L — ABNORMAL LOW (ref 39–117)
BUN: 22 mg/dL (ref 6–23)
CO2: 31 mEq/L (ref 19–32)
Calcium: 9.4 mg/dL (ref 8.4–10.5)
Chloride: 101 mEq/L (ref 96–112)
Creatinine, Ser: 1 mg/dL (ref 0.40–1.50)
GFR: 75.98 mL/min (ref >60.00)
Glucose, Bld: 104 mg/dL — ABNORMAL HIGH (ref 70–99)
Potassium: 4.4 mEq/L (ref 3.5–5.1)
Sodium: 138 mEq/L (ref 135–145)
Total Bilirubin: 1 mg/dL (ref 0.2–1.2)
Total Protein: 7 g/dL (ref 6.0–8.3)

## 2020-08-11 LAB — LIPID PANEL
Cholesterol: 203 mg/dL — ABNORMAL HIGH (ref 0–200)
HDL: 61.4 mg/dL (ref >39.00)
LDL Cholesterol: 123 mg/dL — ABNORMAL HIGH (ref 0–99)
NonHDL: 141.3
Total CHOL/HDL Ratio: 3
Triglycerides: 90 mg/dL (ref 0.0–149.0)
VLDL: 18 mg/dL (ref 0.0–40.0)

## 2020-08-11 LAB — HEMOGLOBIN A1C: Hgb A1c MFr Bld: 4.9 % (ref 4.6–6.5)

## 2020-08-11 LAB — CBC
HCT: 42.9 % (ref 39.0–52.0)
Hemoglobin: 15 g/dL (ref 13.0–17.0)
MCHC: 34.9 g/dL (ref 30.0–36.0)
MCV: 89.2 fl (ref 78.0–100.0)
Platelets: 155 10*3/uL (ref 150.0–400.0)
RBC: 4.81 Mil/uL (ref 4.22–5.81)
RDW: 12.9 % (ref 11.5–15.5)
WBC: 4.6 10*3/uL (ref 4.0–10.5)

## 2020-08-11 LAB — PSA, MEDICARE: PSA: 0.51 ng/ml (ref 0.10–4.00)

## 2020-08-11 NOTE — Assessment & Plan Note (Signed)
Checking A1C this year. Await results.

## 2020-08-11 NOTE — Progress Notes (Signed)
Subjective:    Patient ID: Larry Bentley, male    DOB: 07/08/1950, 70 y.o.   MRN: [id number]  HPI  Larry Bentley is a very pleasant 70 y.o. male who presents today for follow up of chronic conditions and MWV Part 2.   Overall doing well, no concerns today.  Following with ENT for meniere's disease. Managed on HCTZ 12.5 mg for which he takes three times weekly. He has also been on a low salt diet.   Following with Urology, no longer on Cialis for erectile dysfunction.   Immunizations: -Covid-19: 2 vaccines -Shingles: Never completed, not covered by insurance.  -Pneumonia: Prevnar 13 in 2019, Pneumovax in 2020  Diet: Fair diet.  Exercise: No regular exercise, walking some  Eye exam: Completes annually  Dental exam: Completes semi-annually   Colonoscopy: Completed in 2016, due 2026 PSA: Due  BP Readings from Last 3 Encounters:  08/11/20 138/68  11/11/19 (!) 176/50  09/10/19 120/60      Review of Systems  Respiratory:  Negative for shortness of breath.   Cardiovascular:  Negative for chest pain.  Gastrointestinal:  Negative for constipation.  Neurological:  Negative for headaches.  Psychiatric/Behavioral:  The patient is not nervous/anxious.         Past Medical History:  Diagnosis Date   Hypertension    Pneumonia    Porphyria cutanea tarda (HCC)    SBO (small bowel obstruction) (HCC)     Social History   Socioeconomic History   Marital status: Married    Spouse name: Not on file   Number of children: Not on file   Years of education: Not on file   Highest education level: Not on file  Occupational History   Not on file  Tobacco Use   Smoking status: Former    Types: Cigarettes    Quit date: 01/08/1987    Years since quitting: 33.6   Smokeless tobacco: Never  Vaping Use   Vaping Use: Never used  Substance and Sexual Activity   Alcohol use: Yes    Alcohol/week: 10.0 standard drinks    Types: 10 Cans of beer per week   Drug use: Not Currently    Sexual activity: Not on file  Other Topics Concern   Not on file  Social History Narrative   Married.   1 biologic child. 4 grandchildren.    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   Enjoys projects around the home, computers.    Social Determinants of Health   Financial Resource Strain: Low Risk    Difficulty of Paying Living Expenses: Not hard at all  Food Insecurity: No Food Insecurity   Worried About Programme researcher, broadcasting/film/video in the Last Year: Never true   Ran Out of Food in the Last Year: Never true  Transportation Needs: No Transportation Needs   Lack of Transportation (Medical): No   Lack of Transportation (Non-Medical): No  Physical Activity: Inactive   Days of Exercise per Week: 0 days   Minutes of Exercise per Session: 0 min  Stress: No Stress Concern Present   Feeling of Stress : Not at all  Social Connections: Not on file  Intimate Partner Violence: Not At Risk   Fear of Current or Ex-Partner: No   Emotionally Abused: No   Physically Abused: No   Sexually Abused: No    Past Surgical History:  Procedure Laterality Date   HEMORROIDECTOMY  08/11/2014    Family History  Problem Relation Age of Onset  Arthritis Mother    Heart disease Mother    Heart disease Father    Hyperlipidemia Father    Hypertension Father    Cancer Maternal Grandfather        brain   Stroke Paternal Grandmother    Cancer Paternal Grandfather        lung    No Known Allergies  Current Outpatient Medications on File Prior to Visit  Medication Sig Dispense Refill   DENTA 5000 PLUS 1.1 % CREA dental cream USE TO BRUSH TEETH 1-2 TIMES PER DAY. NEITHER BRAND NOR GENERIC COVERED  1   hydrochlorothiazide (HYDRODIURIL) 12.5 MG tablet Take 12.5 mg by mouth. 1 tablet every 3 days     OVER THE COUNTER MEDICATION Metagenics Men's vitality vitamin  Taking 1 packet by mouth once daily     OVER THE COUNTER MEDICATION Metagenics Sinuplex  Taking 1 tablet by mouth once daily     OVER THE COUNTER  MEDICATION Met[employer]ics D3 Liquid 25 mcg per drop, 3-4 drops daily     OVER THE COUNTER MEDICATION Met[employer]ics Ultra Potent-C 1000, daily     Polyethylene Glycol 3350 (MIRALAX PO) Take by mouth.     No current facility-administered medications on file prior to visit.    BP 138/68   Pulse (!) 56   Temp 97.7 F (36.5 C) (Temporal)   Ht 6' (1.829 m)   Wt 210 lb (95.3 kg)   SpO2 99%   BMI 28.48 kg/m  Objective:   Physical Exam HENT:     Right Ear: Tympanic membrane and ear canal normal.     Left Ear: Tympanic membrane and ear canal normal.     Nose: Nose normal.     Right Sinus: No maxillary sinus tenderness or frontal sinus tenderness.     Left Sinus: No maxillary sinus tenderness or frontal sinus tenderness.  Eyes:     Conjunctiva/sclera: Conjunctivae normal.  Neck:     Thyroid: No thyromegaly.     Vascular: No carotid bruit.  Cardiovascular:     Rate and Rhythm: Normal rate and regular rhythm.     Heart sounds: Normal heart sounds.  Pulmonary:     Effort: Pulmonary effort is normal.     Breath sounds: Normal breath sounds. No wheezing or rales.  Abdominal:     General: Bowel sounds are normal.     Palpations: Abdomen is soft.     Tenderness: There is no abdominal tenderness.  Musculoskeletal:        General: Normal range of motion.     Cervical back: Neck supple.  Skin:    General: Skin is warm and dry.  Neurological:     Mental Status: He is alert and oriented to person, place, and time.     Cranial Nerves: No cranial nerve deficit.     Deep Tendon Reflexes: Reflexes are normal and symmetric.  Psychiatric:        Mood and Affect: Mood normal.          Assessment & Plan:      This visit occurred during the SARS-CoV-2 public health emergency.  Safety protocols were in place, including screening questions prior to the visit, additional usage of staff PPE, and extensive cleaning of exam room while observing appropriate contact time as indicated for disinfecting  solutions.

## 2020-08-11 NOTE — Assessment & Plan Note (Signed)
Stable, doing well on HCTZ 12.5 mg every 3 days.  Continue same. Follows with ENT.

## 2020-08-11 NOTE — Assessment & Plan Note (Signed)
Discussed the importance of a healthy diet and regular exercise in order for weight loss, and to reduce the risk of further co-morbidity.  Repeat lipid panel pending. 

## 2020-08-11 NOTE — Assessment & Plan Note (Signed)
No concerns today. Continue to monitor. 

## 2020-08-11 NOTE — Patient Instructions (Signed)
Stop by the lab prior to leaving today. I will notify you of your results once received.   It was a pleasure to see you today!  

## 2020-08-31 DIAGNOSIS — M9903 Segmental and somatic dysfunction of lumbar region: Secondary | ICD-10-CM | POA: Diagnosis not present

## 2020-08-31 DIAGNOSIS — M5416 Radiculopathy, lumbar region: Secondary | ICD-10-CM | POA: Diagnosis not present

## 2020-08-31 DIAGNOSIS — M955 Acquired deformity of pelvis: Secondary | ICD-10-CM | POA: Diagnosis not present

## 2020-08-31 DIAGNOSIS — M9905 Segmental and somatic dysfunction of pelvic region: Secondary | ICD-10-CM | POA: Diagnosis not present

## 2020-09-28 DIAGNOSIS — M9905 Segmental and somatic dysfunction of pelvic region: Secondary | ICD-10-CM | POA: Diagnosis not present

## 2020-09-28 DIAGNOSIS — M955 Acquired deformity of pelvis: Secondary | ICD-10-CM | POA: Diagnosis not present

## 2020-09-28 DIAGNOSIS — M9903 Segmental and somatic dysfunction of lumbar region: Secondary | ICD-10-CM | POA: Diagnosis not present

## 2020-09-28 DIAGNOSIS — M5416 Radiculopathy, lumbar region: Secondary | ICD-10-CM | POA: Diagnosis not present

## 2020-10-26 DIAGNOSIS — M9905 Segmental and somatic dysfunction of pelvic region: Secondary | ICD-10-CM | POA: Diagnosis not present

## 2020-10-26 DIAGNOSIS — M9903 Segmental and somatic dysfunction of lumbar region: Secondary | ICD-10-CM | POA: Diagnosis not present

## 2020-10-26 DIAGNOSIS — M955 Acquired deformity of pelvis: Secondary | ICD-10-CM | POA: Diagnosis not present

## 2020-10-26 DIAGNOSIS — M5416 Radiculopathy, lumbar region: Secondary | ICD-10-CM | POA: Diagnosis not present

## 2020-11-05 ENCOUNTER — Encounter: Payer: Self-pay | Admitting: Emergency Medicine

## 2020-11-05 ENCOUNTER — Ambulatory Visit
Admission: EM | Admit: 2020-11-05 | Discharge: 2020-11-05 | Disposition: A | Discharge status: Home / Self Care | Payer: Medicare Other | Attending: Emergency Medicine | Admitting: Emergency Medicine

## 2020-11-05 ENCOUNTER — Other Ambulatory Visit: Payer: Self-pay

## 2020-11-05 DIAGNOSIS — J029 Acute pharyngitis, unspecified: Secondary | ICD-10-CM | POA: Diagnosis not present

## 2020-11-05 LAB — POCT RAPID STREP A (OFFICE): Rapid Strep A Screen: NEGATIVE

## 2020-11-05 MED ORDER — LIDOCAINE VISCOUS HCL 2 % MT SOLN: OROMUCOSAL | 0 refills | Status: DC

## 2020-11-05 NOTE — ED Provider Notes (Signed)
CHIEF COMPLAINT:   Chief Complaint  Patient presents with   Fever   Sore Throat    SUBJECTIVE/HPI:   Fever Sore Throat  A very pleasant [name] presents today with sore throat that started on Wednesday.  Salt water gargles have been used.  Requesting strep test. Patient does not report any shortness of breath, chest pain, palpitations, visual changes, weakness, tingling, headache, nausea, vomiting, diarrhea, fever, chills.   has a past medical history of Hypertension, Pneumonia, Porphyria cutanea tarda (HCC), and SBO (small bowel obstruction) (HCC).  ROS:  Review of Systems  Constitutional:  Positive for fever.  See Subjective/HPI Medications, Allergies and Problem List personally reviewed in Epic today OBJECTIVE:   Vitals:   11/05/20 1047  BP: (!) 162/65  Pulse: 71  Resp: 18  Temp: 98.7 F (37.1 C)  SpO2: 97%    Physical Exam   General: Appears well-developed and well-nourished. No acute distress.  HEENT Head: Normocephalic and atraumatic.   Ears: Hearing grossly intact, no drainage or visible deformity.  Nose: No nasal deviation.   Mouth/Throat: No stridor or tracheal deviation.  Non erythematous posterior pharynx noted with clear drainage present.  No white patchy exudate noted. Eyes: Conjunctivae and EOM are normal. No eye drainage or scleral icterus bilaterally.  Neck: Normal range of motion, neck is supple. No cervical, tonsillar or submandibular lymph nodes palpated. Cardiovascular: Normal rate.  Pulm/Chest: No respiratory distress. Neurological: Alert and oriented to person, place, and time.  Skin: Skin is warm and dry.  No rashes, lesions, abrasions or bruising noted to skin.   Psychiatric: Normal mood, affect, behavior, and thought content.   Vital signs and nursing note reviewed.   Patient stable and cooperative with examination. PROCEDURES:    LABS/X-RAYS/EKG/MEDS:   Results for orders placed or performed during the hospital encounter of  11/05/20  POCT rapid strep A  Result Value Ref Range   Rapid Strep A Screen Negative Negative    MEDICAL DECISION MAKING:   Patient presents with sore throat that started on Wednesday.  Salt water gargles have been used.  Requesting strep test. Patient does not report any shortness of breath, chest pain, palpitations, visual changes, weakness, tingling, headache, nausea, vomiting, diarrhea, fever, chills. Negative strep.  Given symptoms along with assessment findings, likely viral pharyngitis.  Rx Magic mouthwash to the patient's preferred pharmacy and advised about home treatment and care to include Tylenol, ibuprofen, warm salt gargles.  Advised to return for any persistent fever, drooling, swelling in the throat or difficulty breathing along with neck stiffness.  Patient verbalized understanding and agreed with treatment plan.  Patient stable upon discharge. ASSESSMENT/PLAN:  1. Viral pharyngitis - magic mouthwash (lidocaine, diphenhydrAMINE, alum & mag hydroxide) suspension; 40 mL diphenhydramine 12.5 mg per 5 mL elixir 40 mL male Maalox 40 mL 2% viscous lidocaine  Use as needed for sore throat up to 4 times daily. Gargle then spit.  Dispense: 120 mL; Refill: 0 Instructions about new medications and side effects provided.  Plan:   Discharge Instructions      Your strep test today was negative. The symptoms you are experiencing are most likely caused by a virus which does not require treatment with antibiotics. Drink plenty of fluids and eat soft foods. Take Tylenol and ibuprofen as directed for pain or fever, as long as there are no contraindications to using these medications.  Use warm salt water gargles for throat pain as needed. Throat lozenges, cepachol, cough drops can be helpful as can  warm tea with honey. Follow up with PCP or return to clinic if not improving as expected in the next 5 to 7 days.  Return for persistent fever, drooling, swelling in the throat, difficulty  breathing, neck stiffness.          Amalia Greenhouse, FNP 11/05/20 9604

## 2020-11-05 NOTE — Discharge Instructions (Signed)
Your strep test today was negative. The symptoms you are experiencing are most likely caused by a virus which does not require treatment with antibiotics. Drink plenty of fluids and eat soft foods. Take Tylenol and ibuprofen as directed for pain or fever, as long as there are no contraindications to using these medications.  Use warm salt water gargles for throat pain as needed. Throat lozenges, cepachol, cough drops can be helpful as can warm tea with honey. Follow up with PCP or return to clinic if not improving as expected in the next 5 to 7 days.  Return for persistent fever, drooling, swelling in the throat, difficulty breathing, neck stiffness.  

## 2020-11-05 NOTE — ED Triage Notes (Addendum)
Patient presents to Urgent Care with complaints of sore throat since weds. Treating with salt water gargles. Req strep test.

## 2020-11-23 DIAGNOSIS — M9905 Segmental and somatic dysfunction of pelvic region: Secondary | ICD-10-CM | POA: Diagnosis not present

## 2020-11-23 DIAGNOSIS — M5416 Radiculopathy, lumbar region: Secondary | ICD-10-CM | POA: Diagnosis not present

## 2020-11-23 DIAGNOSIS — M955 Acquired deformity of pelvis: Secondary | ICD-10-CM | POA: Diagnosis not present

## 2020-11-23 DIAGNOSIS — M9903 Segmental and somatic dysfunction of lumbar region: Secondary | ICD-10-CM | POA: Diagnosis not present

## 2020-12-28 DIAGNOSIS — M9905 Segmental and somatic dysfunction of pelvic region: Secondary | ICD-10-CM | POA: Diagnosis not present

## 2020-12-28 DIAGNOSIS — M955 Acquired deformity of pelvis: Secondary | ICD-10-CM | POA: Diagnosis not present

## 2020-12-28 DIAGNOSIS — M9903 Segmental and somatic dysfunction of lumbar region: Secondary | ICD-10-CM | POA: Diagnosis not present

## 2020-12-28 DIAGNOSIS — M5416 Radiculopathy, lumbar region: Secondary | ICD-10-CM | POA: Diagnosis not present

## 2021-01-25 DIAGNOSIS — M955 Acquired deformity of pelvis: Secondary | ICD-10-CM | POA: Diagnosis not present

## 2021-01-25 DIAGNOSIS — M9903 Segmental and somatic dysfunction of lumbar region: Secondary | ICD-10-CM | POA: Diagnosis not present

## 2021-01-25 DIAGNOSIS — M5416 Radiculopathy, lumbar region: Secondary | ICD-10-CM | POA: Diagnosis not present

## 2021-01-25 DIAGNOSIS — M9905 Segmental and somatic dysfunction of pelvic region: Secondary | ICD-10-CM | POA: Diagnosis not present

## 2021-02-22 DIAGNOSIS — M9903 Segmental and somatic dysfunction of lumbar region: Secondary | ICD-10-CM | POA: Diagnosis not present

## 2021-02-22 DIAGNOSIS — M5416 Radiculopathy, lumbar region: Secondary | ICD-10-CM | POA: Diagnosis not present

## 2021-02-22 DIAGNOSIS — M9905 Segmental and somatic dysfunction of pelvic region: Secondary | ICD-10-CM | POA: Diagnosis not present

## 2021-02-22 DIAGNOSIS — M955 Acquired deformity of pelvis: Secondary | ICD-10-CM | POA: Diagnosis not present

## 2021-03-22 DIAGNOSIS — M5416 Radiculopathy, lumbar region: Secondary | ICD-10-CM | POA: Diagnosis not present

## 2021-03-22 DIAGNOSIS — M9903 Segmental and somatic dysfunction of lumbar region: Secondary | ICD-10-CM | POA: Diagnosis not present

## 2021-03-22 DIAGNOSIS — M955 Acquired deformity of pelvis: Secondary | ICD-10-CM | POA: Diagnosis not present

## 2021-03-22 DIAGNOSIS — M9905 Segmental and somatic dysfunction of pelvic region: Secondary | ICD-10-CM | POA: Diagnosis not present

## 2021-04-19 DIAGNOSIS — M9905 Segmental and somatic dysfunction of pelvic region: Secondary | ICD-10-CM | POA: Diagnosis not present

## 2021-04-19 DIAGNOSIS — M5416 Radiculopathy, lumbar region: Secondary | ICD-10-CM | POA: Diagnosis not present

## 2021-04-19 DIAGNOSIS — M955 Acquired deformity of pelvis: Secondary | ICD-10-CM | POA: Diagnosis not present

## 2021-04-19 DIAGNOSIS — M9903 Segmental and somatic dysfunction of lumbar region: Secondary | ICD-10-CM | POA: Diagnosis not present

## 2021-05-17 DIAGNOSIS — M9903 Segmental and somatic dysfunction of lumbar region: Secondary | ICD-10-CM | POA: Diagnosis not present

## 2021-05-17 DIAGNOSIS — M955 Acquired deformity of pelvis: Secondary | ICD-10-CM | POA: Diagnosis not present

## 2021-05-17 DIAGNOSIS — M5416 Radiculopathy, lumbar region: Secondary | ICD-10-CM | POA: Diagnosis not present

## 2021-05-17 DIAGNOSIS — M9905 Segmental and somatic dysfunction of pelvic region: Secondary | ICD-10-CM | POA: Diagnosis not present

## 2021-06-14 DIAGNOSIS — M5416 Radiculopathy, lumbar region: Secondary | ICD-10-CM | POA: Diagnosis not present

## 2021-06-14 DIAGNOSIS — M955 Acquired deformity of pelvis: Secondary | ICD-10-CM | POA: Diagnosis not present

## 2021-06-14 DIAGNOSIS — M9905 Segmental and somatic dysfunction of pelvic region: Secondary | ICD-10-CM | POA: Diagnosis not present

## 2021-06-14 DIAGNOSIS — M9903 Segmental and somatic dysfunction of lumbar region: Secondary | ICD-10-CM | POA: Diagnosis not present

## 2021-07-12 DIAGNOSIS — M955 Acquired deformity of pelvis: Secondary | ICD-10-CM | POA: Diagnosis not present

## 2021-07-12 DIAGNOSIS — M9905 Segmental and somatic dysfunction of pelvic region: Secondary | ICD-10-CM | POA: Diagnosis not present

## 2021-07-12 DIAGNOSIS — M5416 Radiculopathy, lumbar region: Secondary | ICD-10-CM | POA: Diagnosis not present

## 2021-07-12 DIAGNOSIS — M9903 Segmental and somatic dysfunction of lumbar region: Secondary | ICD-10-CM | POA: Diagnosis not present

## 2021-08-09 DIAGNOSIS — M9905 Segmental and somatic dysfunction of pelvic region: Secondary | ICD-10-CM | POA: Diagnosis not present

## 2021-08-09 DIAGNOSIS — M5416 Radiculopathy, lumbar region: Secondary | ICD-10-CM | POA: Diagnosis not present

## 2021-08-09 DIAGNOSIS — M9903 Segmental and somatic dysfunction of lumbar region: Secondary | ICD-10-CM | POA: Diagnosis not present

## 2021-08-09 DIAGNOSIS — M955 Acquired deformity of pelvis: Secondary | ICD-10-CM | POA: Diagnosis not present

## 2021-08-16 ENCOUNTER — Ambulatory Visit: Payer: Medicare Other

## 2021-08-16 ENCOUNTER — Encounter: Payer: Medicare Other | Admitting: Primary Care

## 2021-09-13 DIAGNOSIS — M955 Acquired deformity of pelvis: Secondary | ICD-10-CM | POA: Diagnosis not present

## 2021-09-13 DIAGNOSIS — M5416 Radiculopathy, lumbar region: Secondary | ICD-10-CM | POA: Diagnosis not present

## 2021-09-13 DIAGNOSIS — M9903 Segmental and somatic dysfunction of lumbar region: Secondary | ICD-10-CM | POA: Diagnosis not present

## 2021-09-13 DIAGNOSIS — M9905 Segmental and somatic dysfunction of pelvic region: Secondary | ICD-10-CM | POA: Diagnosis not present

## 2021-10-11 DIAGNOSIS — M5416 Radiculopathy, lumbar region: Secondary | ICD-10-CM | POA: Diagnosis not present

## 2021-10-11 DIAGNOSIS — M9903 Segmental and somatic dysfunction of lumbar region: Secondary | ICD-10-CM | POA: Diagnosis not present

## 2021-10-11 DIAGNOSIS — M9905 Segmental and somatic dysfunction of pelvic region: Secondary | ICD-10-CM | POA: Diagnosis not present

## 2021-10-11 DIAGNOSIS — M955 Acquired deformity of pelvis: Secondary | ICD-10-CM | POA: Diagnosis not present

## 2021-10-31 ENCOUNTER — Telehealth: Payer: Self-pay | Admitting: Primary Care

## 2021-10-31 NOTE — Telephone Encounter (Signed)
Left message for patient to call back and schedule Medicare Annual Wellness Visit (AWV) either virtually or phone   Left  my Larry Bentley number [phone]   Last AWV 08/07/20 please schedule with Nurse Health Adviser  45 min for awv-i and in office appointments 30 min for awv-s  phone/virtual appointments

## 2021-11-08 DIAGNOSIS — M955 Acquired deformity of pelvis: Secondary | ICD-10-CM | POA: Diagnosis not present

## 2021-11-08 DIAGNOSIS — M9905 Segmental and somatic dysfunction of pelvic region: Secondary | ICD-10-CM | POA: Diagnosis not present

## 2021-11-08 DIAGNOSIS — M9903 Segmental and somatic dysfunction of lumbar region: Secondary | ICD-10-CM | POA: Diagnosis not present

## 2021-11-08 DIAGNOSIS — M5416 Radiculopathy, lumbar region: Secondary | ICD-10-CM | POA: Diagnosis not present

## 2021-12-06 DIAGNOSIS — M955 Acquired deformity of pelvis: Secondary | ICD-10-CM | POA: Diagnosis not present

## 2021-12-06 DIAGNOSIS — M9905 Segmental and somatic dysfunction of pelvic region: Secondary | ICD-10-CM | POA: Diagnosis not present

## 2021-12-06 DIAGNOSIS — M9903 Segmental and somatic dysfunction of lumbar region: Secondary | ICD-10-CM | POA: Diagnosis not present

## 2021-12-06 DIAGNOSIS — M5416 Radiculopathy, lumbar region: Secondary | ICD-10-CM | POA: Diagnosis not present

## 2022-01-03 DIAGNOSIS — M9905 Segmental and somatic dysfunction of pelvic region: Secondary | ICD-10-CM | POA: Diagnosis not present

## 2022-01-03 DIAGNOSIS — M5416 Radiculopathy, lumbar region: Secondary | ICD-10-CM | POA: Diagnosis not present

## 2022-01-03 DIAGNOSIS — M9903 Segmental and somatic dysfunction of lumbar region: Secondary | ICD-10-CM | POA: Diagnosis not present

## 2022-01-03 DIAGNOSIS — M955 Acquired deformity of pelvis: Secondary | ICD-10-CM | POA: Diagnosis not present

## 2022-01-11 DIAGNOSIS — E7800 Pure hypercholesterolemia, unspecified: Secondary | ICD-10-CM | POA: Diagnosis not present

## 2022-01-11 DIAGNOSIS — Z6839 Body mass index (BMI) 39.0-39.9, adult: Secondary | ICD-10-CM | POA: Diagnosis not present

## 2022-01-11 DIAGNOSIS — K759 Inflammatory liver disease, unspecified: Secondary | ICD-10-CM | POA: Diagnosis not present

## 2022-01-11 DIAGNOSIS — Z125 Encounter for screening for malignant neoplasm of prostate: Secondary | ICD-10-CM | POA: Diagnosis not present

## 2022-01-17 ENCOUNTER — Telehealth: Payer: Self-pay | Admitting: Primary Care

## 2022-01-17 NOTE — Telephone Encounter (Signed)
LVM for pt to rtn my call to schedule AWV with [facility] call back # [phone] 

## 2022-02-07 DIAGNOSIS — M955 Acquired deformity of pelvis: Secondary | ICD-10-CM | POA: Diagnosis not present

## 2022-02-07 DIAGNOSIS — M9905 Segmental and somatic dysfunction of pelvic region: Secondary | ICD-10-CM | POA: Diagnosis not present

## 2022-02-07 DIAGNOSIS — M5416 Radiculopathy, lumbar region: Secondary | ICD-10-CM | POA: Diagnosis not present

## 2022-02-07 DIAGNOSIS — M9903 Segmental and somatic dysfunction of lumbar region: Secondary | ICD-10-CM | POA: Diagnosis not present

## 2022-03-07 DIAGNOSIS — M9905 Segmental and somatic dysfunction of pelvic region: Secondary | ICD-10-CM | POA: Diagnosis not present

## 2022-03-07 DIAGNOSIS — M5416 Radiculopathy, lumbar region: Secondary | ICD-10-CM | POA: Diagnosis not present

## 2022-03-07 DIAGNOSIS — M955 Acquired deformity of pelvis: Secondary | ICD-10-CM | POA: Diagnosis not present

## 2022-03-07 DIAGNOSIS — M9903 Segmental and somatic dysfunction of lumbar region: Secondary | ICD-10-CM | POA: Diagnosis not present

## 2022-04-04 DIAGNOSIS — M955 Acquired deformity of pelvis: Secondary | ICD-10-CM | POA: Diagnosis not present

## 2022-04-04 DIAGNOSIS — M9905 Segmental and somatic dysfunction of pelvic region: Secondary | ICD-10-CM | POA: Diagnosis not present

## 2022-04-04 DIAGNOSIS — M9903 Segmental and somatic dysfunction of lumbar region: Secondary | ICD-10-CM | POA: Diagnosis not present

## 2022-04-04 DIAGNOSIS — M5416 Radiculopathy, lumbar region: Secondary | ICD-10-CM | POA: Diagnosis not present

## 2022-05-02 DIAGNOSIS — M955 Acquired deformity of pelvis: Secondary | ICD-10-CM | POA: Diagnosis not present

## 2022-05-02 DIAGNOSIS — M5416 Radiculopathy, lumbar region: Secondary | ICD-10-CM | POA: Diagnosis not present

## 2022-05-02 DIAGNOSIS — M9905 Segmental and somatic dysfunction of pelvic region: Secondary | ICD-10-CM | POA: Diagnosis not present

## 2022-05-02 DIAGNOSIS — M9903 Segmental and somatic dysfunction of lumbar region: Secondary | ICD-10-CM | POA: Diagnosis not present

## 2022-05-30 DIAGNOSIS — M5416 Radiculopathy, lumbar region: Secondary | ICD-10-CM | POA: Diagnosis not present

## 2022-05-30 DIAGNOSIS — M9903 Segmental and somatic dysfunction of lumbar region: Secondary | ICD-10-CM | POA: Diagnosis not present

## 2022-05-30 DIAGNOSIS — M955 Acquired deformity of pelvis: Secondary | ICD-10-CM | POA: Diagnosis not present

## 2022-05-30 DIAGNOSIS — M9905 Segmental and somatic dysfunction of pelvic region: Secondary | ICD-10-CM | POA: Diagnosis not present

## 2022-06-27 DIAGNOSIS — M9903 Segmental and somatic dysfunction of lumbar region: Secondary | ICD-10-CM | POA: Diagnosis not present

## 2022-06-27 DIAGNOSIS — M955 Acquired deformity of pelvis: Secondary | ICD-10-CM | POA: Diagnosis not present

## 2022-06-27 DIAGNOSIS — M5416 Radiculopathy, lumbar region: Secondary | ICD-10-CM | POA: Diagnosis not present

## 2022-06-27 DIAGNOSIS — M9905 Segmental and somatic dysfunction of pelvic region: Secondary | ICD-10-CM | POA: Diagnosis not present

## 2022-07-05 ENCOUNTER — Other Ambulatory Visit (HOSPITAL_COMMUNITY): Payer: Self-pay

## 2022-07-25 DIAGNOSIS — M5416 Radiculopathy, lumbar region: Secondary | ICD-10-CM | POA: Diagnosis not present

## 2022-07-25 DIAGNOSIS — M9903 Segmental and somatic dysfunction of lumbar region: Secondary | ICD-10-CM | POA: Diagnosis not present

## 2022-07-25 DIAGNOSIS — M955 Acquired deformity of pelvis: Secondary | ICD-10-CM | POA: Diagnosis not present

## 2022-07-25 DIAGNOSIS — M9905 Segmental and somatic dysfunction of pelvic region: Secondary | ICD-10-CM | POA: Diagnosis not present

## 2022-08-09 DIAGNOSIS — Z0000 Encounter for general adult medical examination without abnormal findings: Secondary | ICD-10-CM | POA: Diagnosis not present

## 2022-08-09 DIAGNOSIS — L989 Disorder of the skin and subcutaneous tissue, unspecified: Secondary | ICD-10-CM | POA: Diagnosis not present

## 2022-08-09 DIAGNOSIS — Z1331 Encounter for screening for depression: Secondary | ICD-10-CM | POA: Diagnosis not present

## 2022-08-22 DIAGNOSIS — M9905 Segmental and somatic dysfunction of pelvic region: Secondary | ICD-10-CM | POA: Diagnosis not present

## 2022-08-22 DIAGNOSIS — M5416 Radiculopathy, lumbar region: Secondary | ICD-10-CM | POA: Diagnosis not present

## 2022-08-22 DIAGNOSIS — M955 Acquired deformity of pelvis: Secondary | ICD-10-CM | POA: Diagnosis not present

## 2022-08-22 DIAGNOSIS — M9903 Segmental and somatic dysfunction of lumbar region: Secondary | ICD-10-CM | POA: Diagnosis not present

## 2022-09-20 DIAGNOSIS — M955 Acquired deformity of pelvis: Secondary | ICD-10-CM | POA: Diagnosis not present

## 2022-09-20 DIAGNOSIS — M9905 Segmental and somatic dysfunction of pelvic region: Secondary | ICD-10-CM | POA: Diagnosis not present

## 2022-09-20 DIAGNOSIS — M5416 Radiculopathy, lumbar region: Secondary | ICD-10-CM | POA: Diagnosis not present

## 2022-09-20 DIAGNOSIS — M9903 Segmental and somatic dysfunction of lumbar region: Secondary | ICD-10-CM | POA: Diagnosis not present

## 2022-10-17 DIAGNOSIS — M5416 Radiculopathy, lumbar region: Secondary | ICD-10-CM | POA: Diagnosis not present

## 2022-10-17 DIAGNOSIS — M955 Acquired deformity of pelvis: Secondary | ICD-10-CM | POA: Diagnosis not present

## 2022-10-17 DIAGNOSIS — M9903 Segmental and somatic dysfunction of lumbar region: Secondary | ICD-10-CM | POA: Diagnosis not present

## 2022-10-17 DIAGNOSIS — M9905 Segmental and somatic dysfunction of pelvic region: Secondary | ICD-10-CM | POA: Diagnosis not present

## 2022-11-14 DIAGNOSIS — M9903 Segmental and somatic dysfunction of lumbar region: Secondary | ICD-10-CM | POA: Diagnosis not present

## 2022-11-14 DIAGNOSIS — M5416 Radiculopathy, lumbar region: Secondary | ICD-10-CM | POA: Diagnosis not present

## 2022-11-14 DIAGNOSIS — M955 Acquired deformity of pelvis: Secondary | ICD-10-CM | POA: Diagnosis not present

## 2022-11-14 DIAGNOSIS — M9905 Segmental and somatic dysfunction of pelvic region: Secondary | ICD-10-CM | POA: Diagnosis not present

## 2022-11-22 DIAGNOSIS — H903 Sensorineural hearing loss, bilateral: Secondary | ICD-10-CM | POA: Diagnosis not present

## 2022-12-12 DIAGNOSIS — M5416 Radiculopathy, lumbar region: Secondary | ICD-10-CM | POA: Diagnosis not present

## 2022-12-12 DIAGNOSIS — M9905 Segmental and somatic dysfunction of pelvic region: Secondary | ICD-10-CM | POA: Diagnosis not present

## 2022-12-12 DIAGNOSIS — M9903 Segmental and somatic dysfunction of lumbar region: Secondary | ICD-10-CM | POA: Diagnosis not present

## 2022-12-12 DIAGNOSIS — M955 Acquired deformity of pelvis: Secondary | ICD-10-CM | POA: Diagnosis not present

## 2023-01-09 DIAGNOSIS — M5416 Radiculopathy, lumbar region: Secondary | ICD-10-CM | POA: Diagnosis not present

## 2023-01-09 DIAGNOSIS — M9903 Segmental and somatic dysfunction of lumbar region: Secondary | ICD-10-CM | POA: Diagnosis not present

## 2023-01-09 DIAGNOSIS — M955 Acquired deformity of pelvis: Secondary | ICD-10-CM | POA: Diagnosis not present

## 2023-01-09 DIAGNOSIS — M9905 Segmental and somatic dysfunction of pelvic region: Secondary | ICD-10-CM | POA: Diagnosis not present

## 2023-02-06 DIAGNOSIS — M5416 Radiculopathy, lumbar region: Secondary | ICD-10-CM | POA: Diagnosis not present

## 2023-02-06 DIAGNOSIS — M9903 Segmental and somatic dysfunction of lumbar region: Secondary | ICD-10-CM | POA: Diagnosis not present

## 2023-02-06 DIAGNOSIS — M9905 Segmental and somatic dysfunction of pelvic region: Secondary | ICD-10-CM | POA: Diagnosis not present

## 2023-02-06 DIAGNOSIS — M955 Acquired deformity of pelvis: Secondary | ICD-10-CM | POA: Diagnosis not present

## 2023-03-04 DIAGNOSIS — R051 Acute cough: Secondary | ICD-10-CM | POA: Diagnosis not present

## 2023-03-04 DIAGNOSIS — J22 Unspecified acute lower respiratory infection: Secondary | ICD-10-CM | POA: Diagnosis not present

## 2023-03-06 DIAGNOSIS — M5416 Radiculopathy, lumbar region: Secondary | ICD-10-CM | POA: Diagnosis not present

## 2023-03-06 DIAGNOSIS — M955 Acquired deformity of pelvis: Secondary | ICD-10-CM | POA: Diagnosis not present

## 2023-03-06 DIAGNOSIS — M9903 Segmental and somatic dysfunction of lumbar region: Secondary | ICD-10-CM | POA: Diagnosis not present

## 2023-03-06 DIAGNOSIS — J22 Unspecified acute lower respiratory infection: Secondary | ICD-10-CM | POA: Diagnosis not present

## 2023-03-06 DIAGNOSIS — M9905 Segmental and somatic dysfunction of pelvic region: Secondary | ICD-10-CM | POA: Diagnosis not present

## 2023-03-06 DIAGNOSIS — D72829 Elevated white blood cell count, unspecified: Secondary | ICD-10-CM | POA: Diagnosis not present

## 2023-03-20 DIAGNOSIS — R059 Cough, unspecified: Secondary | ICD-10-CM | POA: Diagnosis not present

## 2023-04-03 DIAGNOSIS — R9389 Abnormal findings on diagnostic imaging of other specified body structures: Secondary | ICD-10-CM | POA: Diagnosis not present

## 2023-04-03 DIAGNOSIS — R918 Other nonspecific abnormal finding of lung field: Secondary | ICD-10-CM | POA: Diagnosis not present

## 2023-04-04 ENCOUNTER — Other Ambulatory Visit: Payer: Self-pay | Admitting: Student

## 2023-04-04 DIAGNOSIS — R9389 Abnormal findings on diagnostic imaging of other specified body structures: Secondary | ICD-10-CM

## 2023-04-04 DIAGNOSIS — R918 Other nonspecific abnormal finding of lung field: Secondary | ICD-10-CM

## 2023-04-09 ENCOUNTER — Ambulatory Visit
Admission: RE | Admit: 2023-04-09 | Discharge: 2023-04-09 | Disposition: A | Discharge status: Home / Self Care | Source: Ambulatory Visit | Attending: Student | Admitting: Student

## 2023-04-09 DIAGNOSIS — R9389 Abnormal findings on diagnostic imaging of other specified body structures: Secondary | ICD-10-CM | POA: Insufficient documentation

## 2023-04-09 DIAGNOSIS — R918 Other nonspecific abnormal finding of lung field: Secondary | ICD-10-CM | POA: Diagnosis not present

## 2023-04-09 MED ORDER — IOHEXOL 300 MG/ML  SOLN
75.0000 mL | Freq: Once | INTRAMUSCULAR | Status: AC | PRN
  Administered 2023-04-09: 75 mL via INTRAVENOUS

## 2023-04-10 DIAGNOSIS — M955 Acquired deformity of pelvis: Secondary | ICD-10-CM | POA: Diagnosis not present

## 2023-04-10 DIAGNOSIS — M5416 Radiculopathy, lumbar region: Secondary | ICD-10-CM | POA: Diagnosis not present

## 2023-04-10 DIAGNOSIS — M9905 Segmental and somatic dysfunction of pelvic region: Secondary | ICD-10-CM | POA: Diagnosis not present

## 2023-04-10 DIAGNOSIS — M9903 Segmental and somatic dysfunction of lumbar region: Secondary | ICD-10-CM | POA: Diagnosis not present

## 2023-04-21 DIAGNOSIS — Z125 Encounter for screening for malignant neoplasm of prostate: Secondary | ICD-10-CM | POA: Diagnosis not present

## 2023-04-21 DIAGNOSIS — E7800 Pure hypercholesterolemia, unspecified: Secondary | ICD-10-CM | POA: Diagnosis not present

## 2023-04-21 DIAGNOSIS — R9389 Abnormal findings on diagnostic imaging of other specified body structures: Secondary | ICD-10-CM | POA: Diagnosis not present

## 2023-05-08 DIAGNOSIS — M9905 Segmental and somatic dysfunction of pelvic region: Secondary | ICD-10-CM | POA: Diagnosis not present

## 2023-05-08 DIAGNOSIS — M955 Acquired deformity of pelvis: Secondary | ICD-10-CM | POA: Diagnosis not present

## 2023-05-08 DIAGNOSIS — M5416 Radiculopathy, lumbar region: Secondary | ICD-10-CM | POA: Diagnosis not present

## 2023-05-08 DIAGNOSIS — M9903 Segmental and somatic dysfunction of lumbar region: Secondary | ICD-10-CM | POA: Diagnosis not present

## 2023-06-05 DIAGNOSIS — M9905 Segmental and somatic dysfunction of pelvic region: Secondary | ICD-10-CM | POA: Diagnosis not present

## 2023-06-05 DIAGNOSIS — M5416 Radiculopathy, lumbar region: Secondary | ICD-10-CM | POA: Diagnosis not present

## 2023-06-05 DIAGNOSIS — M9903 Segmental and somatic dysfunction of lumbar region: Secondary | ICD-10-CM | POA: Diagnosis not present

## 2023-06-05 DIAGNOSIS — M955 Acquired deformity of pelvis: Secondary | ICD-10-CM | POA: Diagnosis not present

## 2023-07-03 DIAGNOSIS — M5416 Radiculopathy, lumbar region: Secondary | ICD-10-CM | POA: Diagnosis not present

## 2023-07-03 DIAGNOSIS — M955 Acquired deformity of pelvis: Secondary | ICD-10-CM | POA: Diagnosis not present

## 2023-07-03 DIAGNOSIS — M9905 Segmental and somatic dysfunction of pelvic region: Secondary | ICD-10-CM | POA: Diagnosis not present

## 2023-07-03 DIAGNOSIS — M9903 Segmental and somatic dysfunction of lumbar region: Secondary | ICD-10-CM | POA: Diagnosis not present

## 2023-07-31 DIAGNOSIS — M5416 Radiculopathy, lumbar region: Secondary | ICD-10-CM | POA: Diagnosis not present

## 2023-07-31 DIAGNOSIS — M9903 Segmental and somatic dysfunction of lumbar region: Secondary | ICD-10-CM | POA: Diagnosis not present

## 2023-07-31 DIAGNOSIS — M955 Acquired deformity of pelvis: Secondary | ICD-10-CM | POA: Diagnosis not present

## 2023-07-31 DIAGNOSIS — M9905 Segmental and somatic dysfunction of pelvic region: Secondary | ICD-10-CM | POA: Diagnosis not present

## 2023-08-14 DIAGNOSIS — E7800 Pure hypercholesterolemia, unspecified: Secondary | ICD-10-CM | POA: Diagnosis not present

## 2023-08-14 DIAGNOSIS — Z125 Encounter for screening for malignant neoplasm of prostate: Secondary | ICD-10-CM | POA: Diagnosis not present

## 2023-08-14 DIAGNOSIS — R9389 Abnormal findings on diagnostic imaging of other specified body structures: Secondary | ICD-10-CM | POA: Diagnosis not present

## 2023-08-18 DIAGNOSIS — M549 Dorsalgia, unspecified: Secondary | ICD-10-CM | POA: Diagnosis not present

## 2023-08-18 DIAGNOSIS — Z1331 Encounter for screening for depression: Secondary | ICD-10-CM | POA: Diagnosis not present

## 2023-08-18 DIAGNOSIS — Z0000 Encounter for general adult medical examination without abnormal findings: Secondary | ICD-10-CM | POA: Diagnosis not present

## 2023-08-18 DIAGNOSIS — K648 Other hemorrhoids: Secondary | ICD-10-CM | POA: Diagnosis not present

## 2023-08-28 DIAGNOSIS — M9905 Segmental and somatic dysfunction of pelvic region: Secondary | ICD-10-CM | POA: Diagnosis not present

## 2023-08-28 DIAGNOSIS — M5416 Radiculopathy, lumbar region: Secondary | ICD-10-CM | POA: Diagnosis not present

## 2023-08-28 DIAGNOSIS — M9903 Segmental and somatic dysfunction of lumbar region: Secondary | ICD-10-CM | POA: Diagnosis not present

## 2023-08-28 DIAGNOSIS — M955 Acquired deformity of pelvis: Secondary | ICD-10-CM | POA: Diagnosis not present

## 2023-09-25 DIAGNOSIS — M9903 Segmental and somatic dysfunction of lumbar region: Secondary | ICD-10-CM | POA: Diagnosis not present

## 2023-09-25 DIAGNOSIS — M955 Acquired deformity of pelvis: Secondary | ICD-10-CM | POA: Diagnosis not present

## 2023-09-25 DIAGNOSIS — M9905 Segmental and somatic dysfunction of pelvic region: Secondary | ICD-10-CM | POA: Diagnosis not present

## 2023-09-25 DIAGNOSIS — M5416 Radiculopathy, lumbar region: Secondary | ICD-10-CM | POA: Diagnosis not present

## 2023-10-23 DIAGNOSIS — M9903 Segmental and somatic dysfunction of lumbar region: Secondary | ICD-10-CM | POA: Diagnosis not present

## 2023-10-23 DIAGNOSIS — M9905 Segmental and somatic dysfunction of pelvic region: Secondary | ICD-10-CM | POA: Diagnosis not present

## 2023-10-23 DIAGNOSIS — M955 Acquired deformity of pelvis: Secondary | ICD-10-CM | POA: Diagnosis not present

## 2023-10-23 DIAGNOSIS — M5416 Radiculopathy, lumbar region: Secondary | ICD-10-CM | POA: Diagnosis not present

## 2023-11-20 DIAGNOSIS — M9903 Segmental and somatic dysfunction of lumbar region: Secondary | ICD-10-CM | POA: Diagnosis not present

## 2023-11-20 DIAGNOSIS — M5416 Radiculopathy, lumbar region: Secondary | ICD-10-CM | POA: Diagnosis not present

## 2023-11-20 DIAGNOSIS — M955 Acquired deformity of pelvis: Secondary | ICD-10-CM | POA: Diagnosis not present

## 2023-11-20 DIAGNOSIS — M9905 Segmental and somatic dysfunction of pelvic region: Secondary | ICD-10-CM | POA: Diagnosis not present

## 2023-12-11 DIAGNOSIS — H903 Sensorineural hearing loss, bilateral: Secondary | ICD-10-CM | POA: Diagnosis not present

## 2023-12-25 DIAGNOSIS — M9905 Segmental and somatic dysfunction of pelvic region: Secondary | ICD-10-CM | POA: Diagnosis not present

## 2023-12-25 DIAGNOSIS — M9903 Segmental and somatic dysfunction of lumbar region: Secondary | ICD-10-CM | POA: Diagnosis not present

## 2023-12-25 DIAGNOSIS — M955 Acquired deformity of pelvis: Secondary | ICD-10-CM | POA: Diagnosis not present

## 2023-12-25 DIAGNOSIS — M5416 Radiculopathy, lumbar region: Secondary | ICD-10-CM | POA: Diagnosis not present
